# Patient Record
Sex: Male | Born: 1985 | Race: White | Hispanic: No | Marital: Single | Smoking: Never smoker
Health system: Southern US, Community
[De-identification: ages and names within clinical notes are randomized; demographics above are authoritative.]

## PROBLEM LIST (undated history)

## (undated) DIAGNOSIS — E782 Mixed hyperlipidemia: Secondary | ICD-10-CM

## (undated) DIAGNOSIS — J4530 Mild persistent asthma, uncomplicated: Secondary | ICD-10-CM

## (undated) DIAGNOSIS — F88 Other disorders of psychological development: Secondary | ICD-10-CM

## (undated) DIAGNOSIS — J302 Other seasonal allergic rhinitis: Secondary | ICD-10-CM

## (undated) DIAGNOSIS — F32A Depression, unspecified: Secondary | ICD-10-CM

## (undated) DIAGNOSIS — J45909 Unspecified asthma, uncomplicated: Secondary | ICD-10-CM

## (undated) DIAGNOSIS — G803 Athetoid cerebral palsy: Secondary | ICD-10-CM

## (undated) DIAGNOSIS — Z6838 Body mass index (BMI) 38.0-38.9, adult: Secondary | ICD-10-CM

## (undated) DIAGNOSIS — K2960 Other gastritis without bleeding: Secondary | ICD-10-CM

## (undated) DIAGNOSIS — R6250 Unspecified lack of expected normal physiological development in childhood: Secondary | ICD-10-CM

## (undated) DIAGNOSIS — I872 Venous insufficiency (chronic) (peripheral): Secondary | ICD-10-CM

## (undated) DIAGNOSIS — Q2730 Arteriovenous malformation, site unspecified: Secondary | ICD-10-CM

## (undated) DIAGNOSIS — K219 Gastro-esophageal reflux disease without esophagitis: Secondary | ICD-10-CM

## (undated) DIAGNOSIS — IMO0001 Reserved for inherently not codable concepts without codable children: Secondary | ICD-10-CM

## (undated) HISTORY — PX: STRABISMUS SURGERY: SHX218

## (undated) HISTORY — DX: Unspecified lack of expected normal physiological development in childhood: R62.50

## (undated) HISTORY — DX: Body mass index (BMI) 38.0-38.9, adult: Z68.38

## (undated) HISTORY — DX: Morbid (severe) obesity due to excess calories: E66.01

## (undated) HISTORY — DX: Other seasonal allergic rhinitis: J30.2

## (undated) HISTORY — DX: Venous insufficiency (chronic) (peripheral): I87.2

## (undated) HISTORY — PX: BRAIN SURGERY: SHX531

## (undated) HISTORY — DX: Athetoid cerebral palsy: G80.3

## (undated) HISTORY — DX: Unspecified asthma, uncomplicated: J45.909

## (undated) HISTORY — DX: Arteriovenous malformation, site unspecified: Q27.30

## (undated) HISTORY — DX: Other disorders of psychological development: F88

## (undated) HISTORY — PX: HERNIA REPAIR: SHX51

## (undated) HISTORY — PX: OTHER SURGICAL HISTORY: SHX169

## (undated) HISTORY — DX: Gastro-esophageal reflux disease without esophagitis: K21.9

## (undated) HISTORY — DX: Mixed hyperlipidemia: E78.2

## (undated) HISTORY — DX: Other gastritis without bleeding: K29.60

## (undated) HISTORY — PX: SKIN SURGERY: SHX2413

## (undated) HISTORY — DX: Mild persistent asthma, uncomplicated: J45.30

## (undated) HISTORY — DX: Reserved for inherently not codable concepts without codable children: IMO0001

## (undated) HISTORY — DX: Depression, unspecified: F32.A

---
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
## 2007-10-04 HISTORY — PX: HERNIA REPAIR: SHX51

## 2009-11-26 ENCOUNTER — Inpatient Hospital Stay (HOSPITAL_COMMUNITY): Admission: AD | Admit: 2009-11-26 | Discharge: 2009-11-30 | Payer: Self-pay | Admitting: Diagnostic Neuroimaging

## 2009-12-23 ENCOUNTER — Ambulatory Visit (HOSPITAL_COMMUNITY): Admission: RE | Admit: 2009-12-23 | Discharge: 2009-12-23 | Payer: Self-pay | Admitting: Neurosurgery

## 2010-01-07 ENCOUNTER — Ambulatory Visit: Payer: Self-pay | Admitting: Pulmonary Disease

## 2010-01-07 ENCOUNTER — Inpatient Hospital Stay (HOSPITAL_COMMUNITY): Admission: RE | Admit: 2010-01-07 | Discharge: 2010-01-11 | Payer: Self-pay | Admitting: Neurosurgery

## 2010-01-08 ENCOUNTER — Encounter (INDEPENDENT_AMBULATORY_CARE_PROVIDER_SITE_OTHER): Payer: Self-pay | Admitting: Neurosurgery

## 2010-02-01 ENCOUNTER — Ambulatory Visit (HOSPITAL_COMMUNITY): Admission: RE | Admit: 2010-02-01 | Discharge: 2010-02-01 | Payer: Self-pay | Admitting: Neurosurgery

## 2010-06-08 IMAGING — CT CT HEAD WO/W CM
2 of 5 series · 16 of 30 positions shown, 19 images · IV contrast (agent unspecified)
Comparison: Cerebral angiogram 11/27/2009.  CTA 11/27/2009.

CLINICAL DATA: 24-year-old male preoperative study for stereotactic
surgical planning.  Right MCA M3 segment AVM and intracranial
hemorrhage.

CT HEAD WITHOUT AND WITH CONTRAST
TECHNIQUE: Contiguous axial images were obtained from the base of
the skull through the vertex without and with intravenous contrast.
Contrast: 100 ml [id number].

[Series 4: stealth head w/o cm · axial · non-contrast · 0.59mm/px · z∈[-184,-49]mm · 9 of 136 slices shown, 12 images]
[im 14/136  brain]
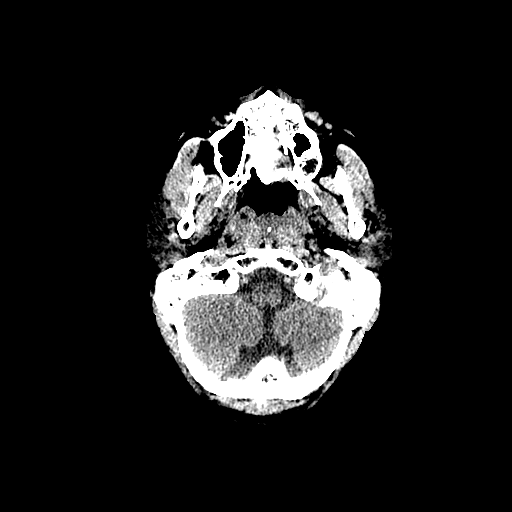
[im 14/136  bone]
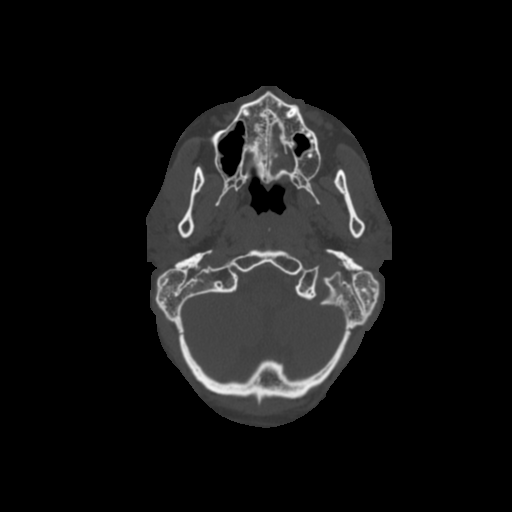
[im 28/136  brain]
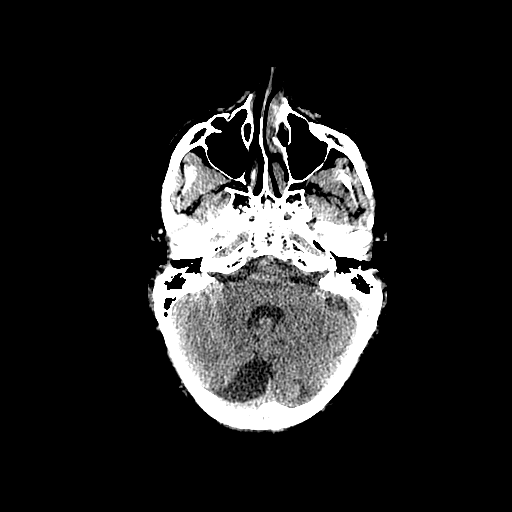
[im 41/136  brain]
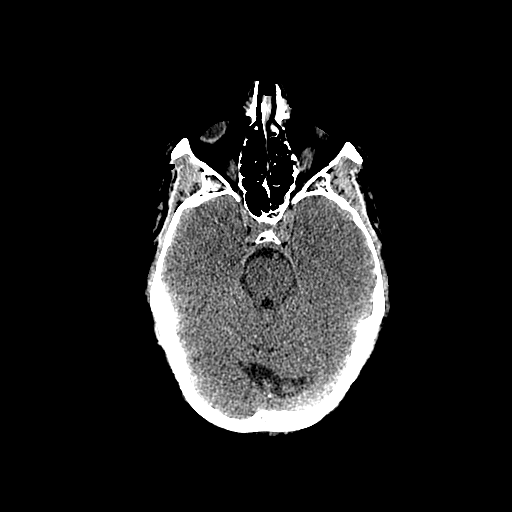
[im 55/136  brain]
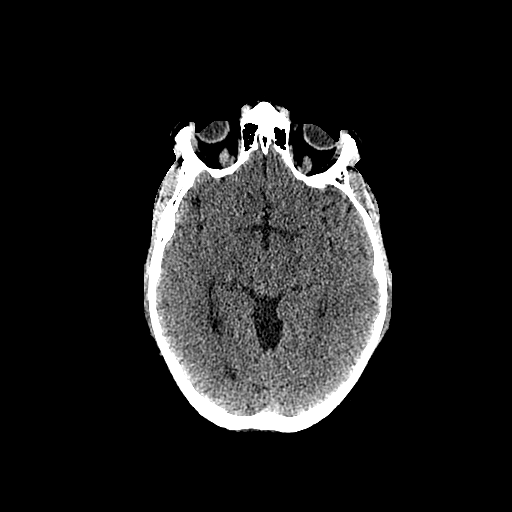
[im 68/136  brain]
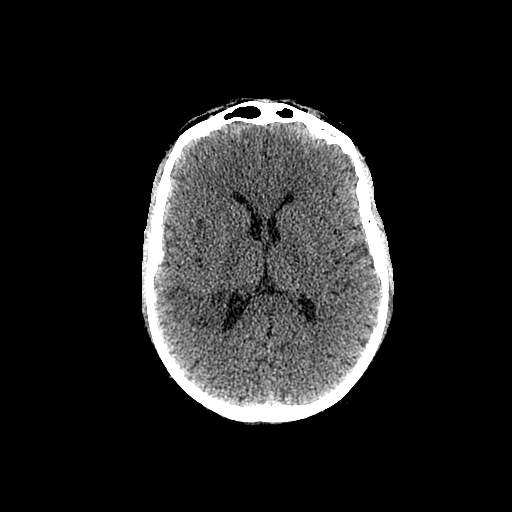
[im 68/136  bone]
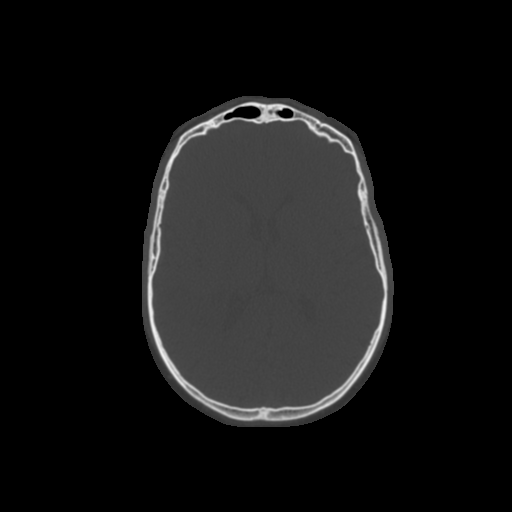
[im 82/136  brain]
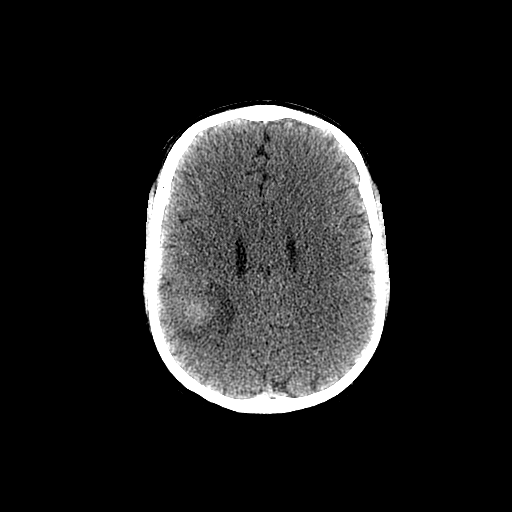
[im 95/136  brain]
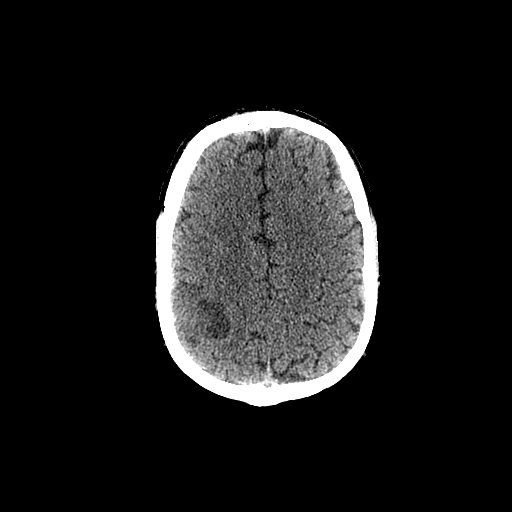
[im 109/136  brain]
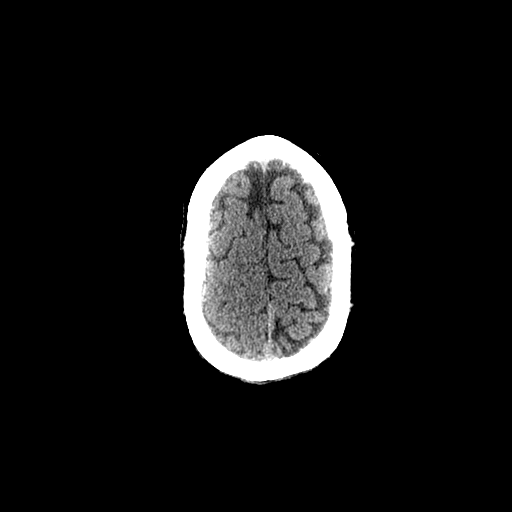
[im 122/136  brain]
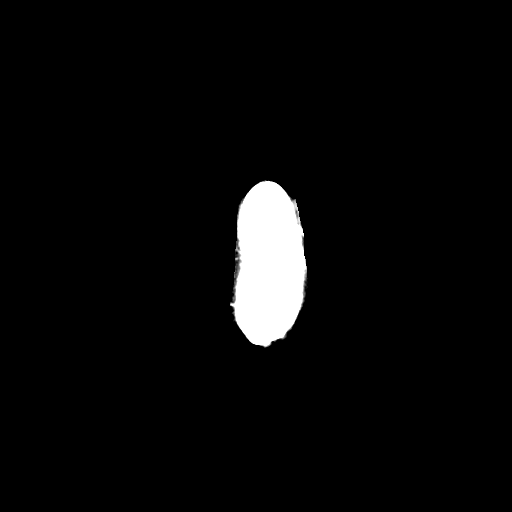
[im 122/136  bone]
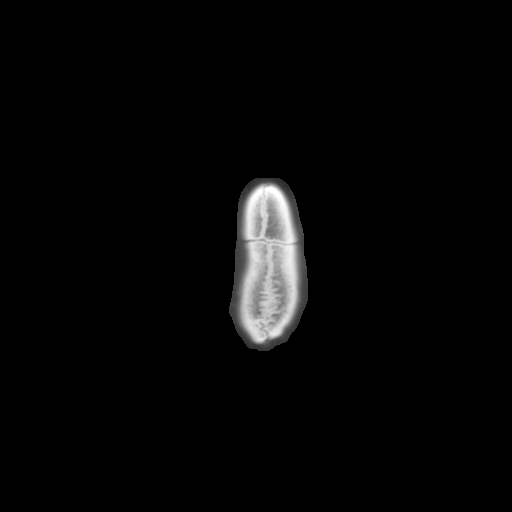

[Series 6: stealth (id) min delay · axial · delayed · 0.59mm/px · z∈[-184,-66]mm · 7 of 136 slices shown]
[im 14/136  brain]
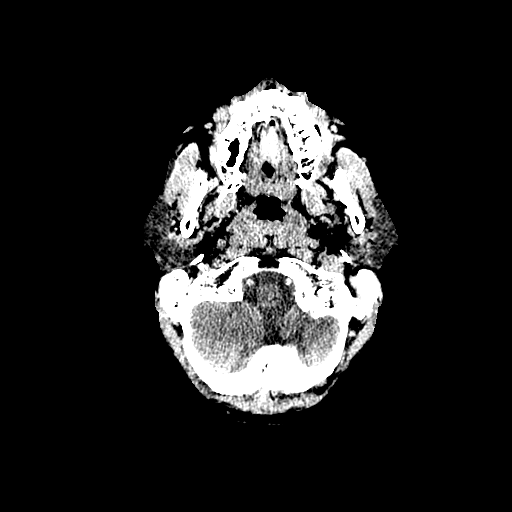
[im 28/136  brain]
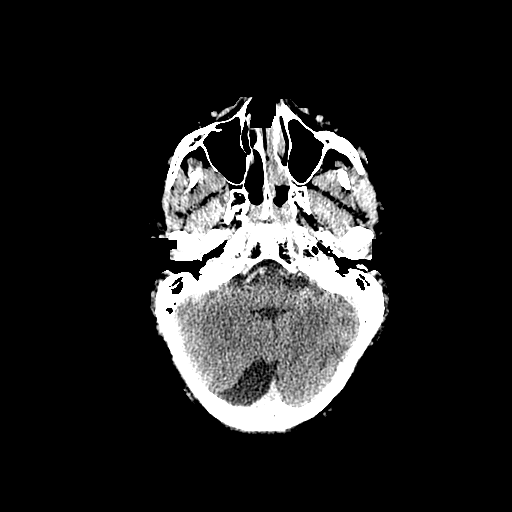
[im 41/136  brain]
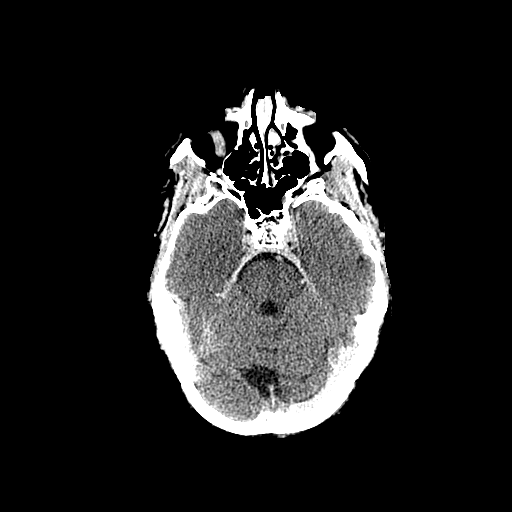
[im 55/136  brain]
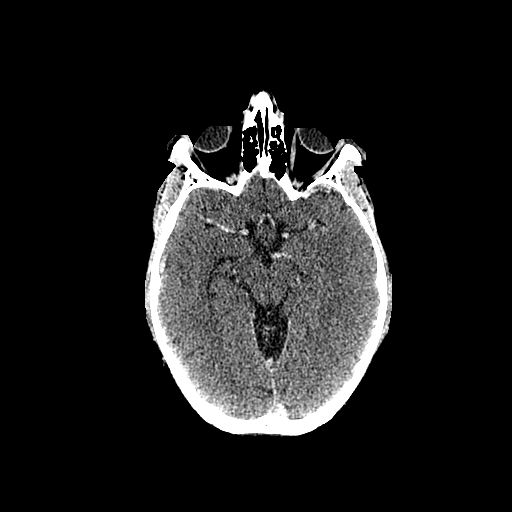
[im 82/136  brain]
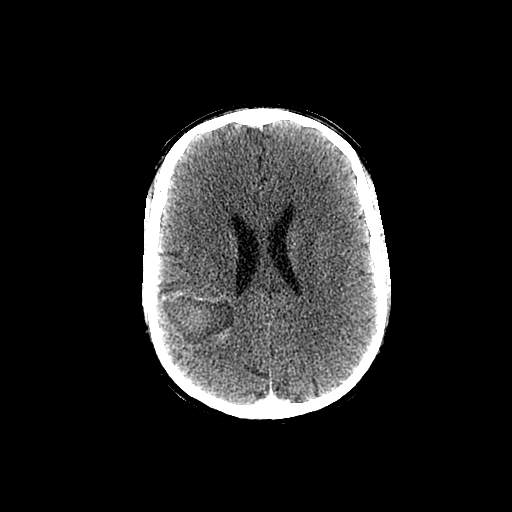
[im 95/136  brain]
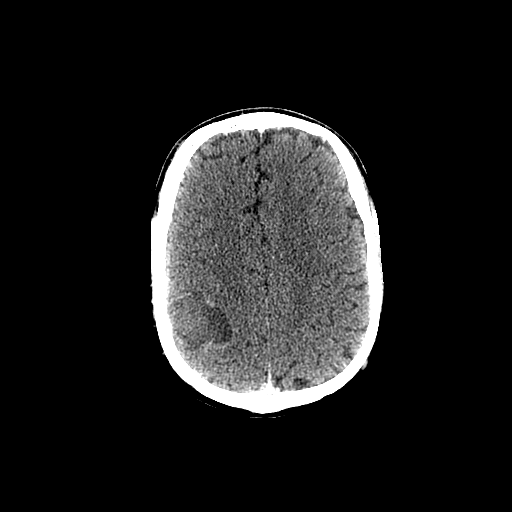
[im 109/136  brain]
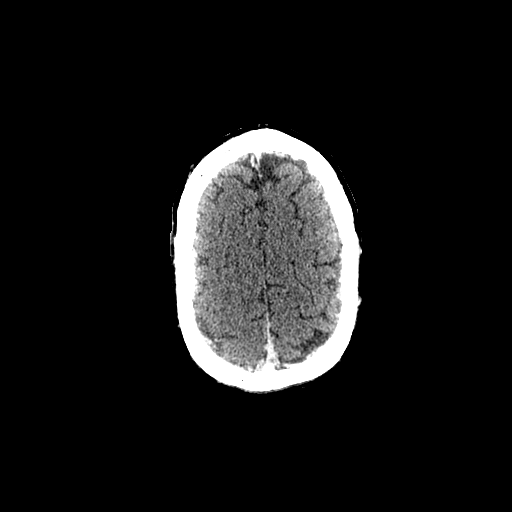

[16 of 30 positions shown; findings below may reference images not displayed]

FINDINGS: The patient's face is included from the level of the
maxillary alveolar process of the maxilla to the vertex. Interval
resolution of midline shift.  Interval expected evolution of the
right hemispheric parenchymal hemorrhage, with hyperdense component
now encompassing 27 x 24 mm (previously 47 x 34 mm).  No extra-
axial hemorrhage is identified. Ventricular size and configuration
are within normal limits.  Normal basilar cisterns.  Stable mega
cisterna magna. Stable visualized osseous structures.  Stable
orbits and scalp.

Postcontrast images reveal mild enhancement surrounding the
parenchymal hemorrhage.  The abnormal vascularity of the
arteriovenous malformation is most apparent on series 6 image 78
along the anterolateral aspect of the hematoma inferiorly.
Prominent draining cortical veins appear to communicate with the
right vein of Labbe as before which is asymmetrically enlarged on
image 56.  Other major intracranial vascular structures are
enhancing.
IMPRESSION: 1.  Study performed for stereotactic surgical planning.
2.  Postcontrast images reveal the right MCA arteriovenous
malformation on series 6 image 78.  Asymmetrically enlarged
superficial right cortical veins communicating with the vein of
Labbe.
3.  Interval resolved mass effect.  Interval expected evolution of
the right parenchymal hematoma with expected mild surrounding
enhancement.

## 2010-12-21 LAB — CBC
MCV: 88.3 fL (ref 78.0–100.0)
RBC: 4.3 MIL/uL (ref 4.22–5.81)
WBC: 7.1 10*3/uL (ref 4.0–10.5)

## 2010-12-21 LAB — BASIC METABOLIC PANEL
BUN: 14 mg/dL (ref 6–23)
CO2: 30 mEq/L (ref 19–32)
Calcium: 8.6 mg/dL (ref 8.4–10.5)
GFR calc Af Amer: >60 mL/min (ref >60)
Glucose, Bld: 86 mg/dL (ref 70–99)
Sodium: 141 mEq/L (ref 135–145)

## 2010-12-21 LAB — APTT: aPTT: 31 seconds (ref 24–37)

## 2010-12-21 LAB — PROTIME-INR: Prothrombin Time: 13.2 seconds (ref 11.6–15.2)

## 2010-12-22 LAB — BLOOD GAS, ARTERIAL
Acid-base deficit: 3.2 mmol/L — ABNORMAL HIGH (ref 0.0–2.0)
Acid-base deficit: 4.1 mmol/L — ABNORMAL HIGH (ref 0.0–2.0)
Bicarbonate: 21.7 mEq/L (ref 20.0–24.0)
FIO2: 0.3 %
FIO2: 0.3 %
MECHVT: 500 mL
O2 Saturation: 98.4 %
RATE: 15 resp/min
RATE: 15 resp/min
TCO2: 21.8 mmol/L (ref 0–100)
TCO2: 25.5 mmol/L (ref 0–100)
pCO2 arterial: 35.2 mmHg (ref 35.0–45.0)
pO2, Arterial: 116 mmHg — ABNORMAL HIGH (ref 80.0–100.0)

## 2010-12-22 LAB — TYPE AND SCREEN: Antibody Screen: NEGATIVE

## 2010-12-22 LAB — BASIC METABOLIC PANEL
BUN: 10 mg/dL (ref 6–23)
BUN: 15 mg/dL (ref 6–23)
CO2: 24 mEq/L (ref 19–32)
CO2: 26 mEq/L (ref 19–32)
Calcium: 7.6 mg/dL — ABNORMAL LOW (ref 8.4–10.5)
Calcium: 8.2 mg/dL — ABNORMAL LOW (ref 8.4–10.5)
Creatinine, Ser: 0.64 mg/dL (ref 0.4–1.5)
Creatinine, Ser: 0.72 mg/dL (ref 0.4–1.5)
GFR calc Af Amer: >60 mL/min (ref >60)
GFR calc non Af Amer: >60 mL/min (ref >60)
GFR calc non Af Amer: >60 mL/min (ref >60)
GFR calc non Af Amer: >60 mL/min (ref >60)
Glucose, Bld: 127 mg/dL — ABNORMAL HIGH (ref 70–99)
Glucose, Bld: 92 mg/dL (ref 70–99)
Sodium: 140 mEq/L (ref 135–145)
Sodium: 143 mEq/L (ref 135–145)

## 2010-12-22 LAB — POCT I-STAT 7, (LYTES, BLD GAS, ICA,H+H)
Acid-Base Excess: 1 mmol/L (ref 0.0–2.0)
Bicarbonate: 23.3 mEq/L (ref 20.0–24.0)
Calcium, Ion: 1.1 mmol/L — ABNORMAL LOW (ref 1.12–1.32)
Calcium, Ion: 1.13 mmol/L (ref 1.12–1.32)
HCT: 38 % — ABNORMAL LOW (ref 39.0–52.0)
HCT: 39 % (ref 39.0–52.0)
Hemoglobin: 11.6 g/dL — ABNORMAL LOW (ref 13.0–17.0)
O2 Saturation: 100 %
O2 Saturation: 100 %
Patient temperature: 37
Patient temperature: 37.2
Potassium: 3.5 mEq/L (ref 3.5–5.1)
Potassium: 4.1 mEq/L (ref 3.5–5.1)
pCO2 arterial: 36.3 mmHg (ref 35.0–45.0)
pCO2 arterial: 38.7 mmHg (ref 35.0–45.0)
pCO2 arterial: 39.9 mmHg (ref 35.0–45.0)
pH, Arterial: 7.376 (ref 7.350–7.450)
pH, Arterial: 7.426 (ref 7.350–7.450)
pO2, Arterial: 511 mmHg — ABNORMAL HIGH (ref 80.0–100.0)

## 2010-12-22 LAB — APTT: aPTT: 30 seconds (ref 24–37)

## 2010-12-22 LAB — DIFFERENTIAL
Basophils Absolute: 0 10*3/uL (ref 0.0–0.1)
Eosinophils Absolute: 0.4 10*3/uL (ref 0.0–0.7)
Eosinophils Relative: 5 % (ref 0–5)
Lymphocytes Relative: 20 % (ref 12–46)
Lymphs Abs: 1.6 10*3/uL (ref 0.7–4.0)
Neutro Abs: 5.3 10*3/uL (ref 1.7–7.7)
Neutrophils Relative %: 66 % (ref 43–77)

## 2010-12-22 LAB — PHOSPHORUS: Phosphorus: 2.2 mg/dL — ABNORMAL LOW (ref 2.3–4.6)

## 2010-12-22 LAB — ABO/RH: ABO/RH(D): O POS

## 2010-12-22 LAB — CBC
HCT: 30.8 % — ABNORMAL LOW (ref 39.0–52.0)
HCT: 44.2 % (ref 39.0–52.0)
Hemoglobin: 10.5 g/dL — ABNORMAL LOW (ref 13.0–17.0)
MCHC: 33.9 g/dL (ref 30.0–36.0)
Platelets: 151 10*3/uL (ref 150–400)
Platelets: 168 10*3/uL (ref 150–400)
RBC: 4.99 MIL/uL (ref 4.22–5.81)
WBC: 11.1 10*3/uL — ABNORMAL HIGH (ref 4.0–10.5)
WBC: 8.1 10*3/uL (ref 4.0–10.5)

## 2010-12-22 LAB — PROTIME-INR: INR: 1.01 (ref 0.00–1.49)

## 2010-12-22 LAB — MAGNESIUM: Magnesium: 2 mg/dL (ref 1.5–2.5)

## 2010-12-22 LAB — POCT I-STAT GLUCOSE: Operator id: 117072

## 2010-12-24 LAB — COMPREHENSIVE METABOLIC PANEL
ALT: 17 U/L (ref 0–53)
ALT: 19 U/L (ref 0–53)
AST: 16 U/L (ref 0–37)
BUN: 14 mg/dL (ref 6–23)
Calcium: 8.4 mg/dL (ref 8.4–10.5)
Calcium: 8.9 mg/dL (ref 8.4–10.5)
GFR calc Af Amer: >60 mL/min (ref >60)
Glucose, Bld: 115 mg/dL — ABNORMAL HIGH (ref 70–99)
Sodium: 136 mEq/L (ref 135–145)
Sodium: 138 mEq/L (ref 135–145)
Total Protein: 6.3 g/dL (ref 6.0–8.3)
Total Protein: 7.1 g/dL (ref 6.0–8.3)

## 2010-12-24 LAB — CBC
Hemoglobin: 13.9 g/dL (ref 13.0–17.0)
Hemoglobin: 14.3 g/dL (ref 13.0–17.0)
MCHC: 33.7 g/dL (ref 30.0–36.0)
MCHC: 34.1 g/dL (ref 30.0–36.0)
MCHC: 34.3 g/dL (ref 30.0–36.0)
MCV: 89.3 fL (ref 78.0–100.0)
Platelets: 261 10*3/uL (ref 150–400)
RDW: 13 % (ref 11.5–15.5)
RDW: 13.1 % (ref 11.5–15.5)
RDW: 13.2 % (ref 11.5–15.5)

## 2010-12-24 LAB — GLUCOSE, CAPILLARY
Glucose-Capillary: 113 mg/dL — ABNORMAL HIGH (ref 70–99)
Glucose-Capillary: 89 mg/dL (ref 70–99)
Glucose-Capillary: 92 mg/dL (ref 70–99)

## 2010-12-24 LAB — BASIC METABOLIC PANEL
CO2: 28 mEq/L (ref 19–32)
Calcium: 8.9 mg/dL (ref 8.4–10.5)
Creatinine, Ser: 0.76 mg/dL (ref 0.4–1.5)
Glucose, Bld: 94 mg/dL (ref 70–99)
Sodium: 137 mEq/L (ref 135–145)

## 2010-12-24 LAB — PROTIME-INR
INR: 1.01 (ref 0.00–1.49)
Prothrombin Time: 13.2 seconds (ref 11.6–15.2)

## 2012-03-06 DIAGNOSIS — J0100 Acute maxillary sinusitis, unspecified: Secondary | ICD-10-CM | POA: Diagnosis not present

## 2012-03-06 DIAGNOSIS — H66009 Acute suppurative otitis media without spontaneous rupture of ear drum, unspecified ear: Secondary | ICD-10-CM | POA: Diagnosis not present

## 2012-05-28 DIAGNOSIS — L819 Disorder of pigmentation, unspecified: Secondary | ICD-10-CM | POA: Diagnosis not present

## 2012-06-05 DIAGNOSIS — I770 Arteriovenous fistula, acquired: Secondary | ICD-10-CM | POA: Diagnosis not present

## 2012-08-03 DIAGNOSIS — Z23 Encounter for immunization: Secondary | ICD-10-CM | POA: Diagnosis not present

## 2013-03-19 DIAGNOSIS — J018 Other acute sinusitis: Secondary | ICD-10-CM | POA: Diagnosis not present

## 2013-03-19 DIAGNOSIS — T148 Other injury of unspecified body region: Secondary | ICD-10-CM | POA: Diagnosis not present

## 2013-04-18 DIAGNOSIS — R059 Cough, unspecified: Secondary | ICD-10-CM | POA: Diagnosis not present

## 2013-04-18 DIAGNOSIS — R05 Cough: Secondary | ICD-10-CM | POA: Diagnosis not present

## 2013-04-18 DIAGNOSIS — J309 Allergic rhinitis, unspecified: Secondary | ICD-10-CM | POA: Diagnosis not present

## 2013-04-18 DIAGNOSIS — J45909 Unspecified asthma, uncomplicated: Secondary | ICD-10-CM | POA: Diagnosis not present

## 2013-04-26 DIAGNOSIS — R002 Palpitations: Secondary | ICD-10-CM | POA: Diagnosis not present

## 2013-04-30 DIAGNOSIS — R002 Palpitations: Secondary | ICD-10-CM | POA: Diagnosis not present

## 2013-05-20 DIAGNOSIS — J45909 Unspecified asthma, uncomplicated: Secondary | ICD-10-CM | POA: Diagnosis not present

## 2013-05-20 DIAGNOSIS — J309 Allergic rhinitis, unspecified: Secondary | ICD-10-CM | POA: Diagnosis not present

## 2013-06-18 DIAGNOSIS — Z833 Family history of diabetes mellitus: Secondary | ICD-10-CM | POA: Diagnosis not present

## 2013-06-18 DIAGNOSIS — K219 Gastro-esophageal reflux disease without esophagitis: Secondary | ICD-10-CM | POA: Diagnosis not present

## 2013-06-18 DIAGNOSIS — Z8249 Family history of ischemic heart disease and other diseases of the circulatory system: Secondary | ICD-10-CM | POA: Diagnosis not present

## 2013-06-18 DIAGNOSIS — Z8679 Personal history of other diseases of the circulatory system: Secondary | ICD-10-CM | POA: Diagnosis not present

## 2013-06-18 DIAGNOSIS — Z789 Other specified health status: Secondary | ICD-10-CM | POA: Diagnosis not present

## 2013-06-18 DIAGNOSIS — R002 Palpitations: Secondary | ICD-10-CM | POA: Diagnosis not present

## 2013-07-04 DIAGNOSIS — R011 Cardiac murmur, unspecified: Secondary | ICD-10-CM | POA: Diagnosis not present

## 2013-07-11 DIAGNOSIS — R002 Palpitations: Secondary | ICD-10-CM | POA: Diagnosis not present

## 2013-07-16 DIAGNOSIS — Z23 Encounter for immunization: Secondary | ICD-10-CM | POA: Diagnosis not present

## 2013-07-16 DIAGNOSIS — R002 Palpitations: Secondary | ICD-10-CM | POA: Diagnosis not present

## 2013-07-22 DIAGNOSIS — J45909 Unspecified asthma, uncomplicated: Secondary | ICD-10-CM | POA: Diagnosis not present

## 2013-07-22 DIAGNOSIS — J309 Allergic rhinitis, unspecified: Secondary | ICD-10-CM | POA: Diagnosis not present

## 2013-08-12 ENCOUNTER — Ambulatory Visit (HOSPITAL_BASED_OUTPATIENT_CLINIC_OR_DEPARTMENT_OTHER): Payer: Medicare Other | Attending: Allergy and Immunology | Admitting: Radiology

## 2013-08-12 VITALS — Ht 68.0 in | Wt 213.0 lb

## 2013-08-12 DIAGNOSIS — G4730 Sleep apnea, unspecified: Secondary | ICD-10-CM

## 2013-08-12 DIAGNOSIS — I498 Other specified cardiac arrhythmias: Secondary | ICD-10-CM | POA: Insufficient documentation

## 2013-08-12 DIAGNOSIS — G4710 Hypersomnia, unspecified: Secondary | ICD-10-CM | POA: Insufficient documentation

## 2013-08-24 DIAGNOSIS — G4730 Sleep apnea, unspecified: Secondary | ICD-10-CM | POA: Diagnosis not present

## 2013-08-25 NOTE — Procedures (Signed)
NAMEIBRAHIM, MCPHEETERS                  ACCOUNT NO.:  [id number]  MEDICAL RECORD NO.:  [id number]          PATIENT TYPE:  OUT  LOCATION:  SLEEP CENTER                 FACILITY:  [facility]  PHYSICIAN:  Alzina Golda D. Maple Hudson, MD, FCCP, FACPDATE OF BIRTH:  07-07-1985  DATE OF STUDY:  08/12/2013                           NOCTURNAL POLYSOMNOGRAM  REFERRING PHYSICIAN:  Jessica Priest, M.D.  INDICATION FOR STUDY:  Hypersomnia with sleep apnea.  EPWORTH SLEEPINESS SCORE:  8/24.  BMI 32.4, weight 213 pounds, height 68 inches, neck 15.5 inches.  MEDICATIONS:  Home medications are charted for review.  SLEEP ARCHITECTURE:  Total sleep time 312 minutes with sleep efficiency 82%.  Stage I was 11.7%, stage II 64.4%, stage III 11.9%, REM 12% of total sleep time.  Sleep latency 4.5 minutes, REM latency 222.5 minutes. Awake after sleep onset 64 minutes.  Arousal index 17.5.  BEDTIME MEDICATION:  Omeprazole, loratadine, QVAR, Nasonex.  RESPIRATORY DATA:  Apnea-hypopnea index (AHI) 0.2 per hour.  A single event was recorded as a central apnea while nonsupine.  CPAP titration was not indicated.  OXYGEN DATA:  Mild-to-moderate snoring with oxygen desaturation to a nadir of 93% and mean oxygen saturation through the study of 97.6% on room air.  CARDIAC DATA:  Sinus rhythm with occasional PAC.  MOVEMENT-PARASOMNIA:  A few incidental limb jerks were noted with no effect on sleep.  Bathroom x2.  IMPRESSIONS-RECOMMENDATIONS: 1. Unremarkable sleep architecture for Sleep Center environment with     bedtime medications as listed above.  Percentage time spent in REM     was less than expected. 2. A single respiratory event with sleep disturbance was recorded,     within normal limits.  AHI 0.2 per hour.  Moderate snoring with     oxygen desaturation to a nadir of 93% and mean oxygen saturation     through the study of 97.6% on room air. 3. Cardiac rhythm was questioned in the ordering documents.  The     rhythm  was sinus bradycardia with mean     heart rate 52.1 per minute, maximum heart rate 115.4 per minute,     and minimum heart rate 35.1 per minute.  Rare premature atrial     contractions.     Alysiana Ethridge D. Maple Hudson, MD, Tonny Bollman, FACP Diplomate, American Board of Sleep Medicine    CDY/MEDQ  D:  08/24/2013 09:39:43  T:  08/25/2013 01:52:33  Job:  [id number]

## 2013-10-04 DIAGNOSIS — J209 Acute bronchitis, unspecified: Secondary | ICD-10-CM | POA: Diagnosis not present

## 2013-10-04 DIAGNOSIS — J018 Other acute sinusitis: Secondary | ICD-10-CM | POA: Diagnosis not present

## 2013-10-24 DIAGNOSIS — R350 Frequency of micturition: Secondary | ICD-10-CM | POA: Diagnosis not present

## 2013-10-24 DIAGNOSIS — R1013 Epigastric pain: Secondary | ICD-10-CM | POA: Diagnosis not present

## 2013-10-28 DIAGNOSIS — J45909 Unspecified asthma, uncomplicated: Secondary | ICD-10-CM | POA: Diagnosis not present

## 2013-10-28 DIAGNOSIS — J309 Allergic rhinitis, unspecified: Secondary | ICD-10-CM | POA: Diagnosis not present

## 2013-12-06 DIAGNOSIS — J209 Acute bronchitis, unspecified: Secondary | ICD-10-CM | POA: Diagnosis not present

## 2014-01-27 DIAGNOSIS — R197 Diarrhea, unspecified: Secondary | ICD-10-CM | POA: Diagnosis not present

## 2014-01-28 DIAGNOSIS — R197 Diarrhea, unspecified: Secondary | ICD-10-CM | POA: Diagnosis not present

## 2014-04-10 DIAGNOSIS — J309 Allergic rhinitis, unspecified: Secondary | ICD-10-CM | POA: Diagnosis not present

## 2014-04-10 DIAGNOSIS — J45909 Unspecified asthma, uncomplicated: Secondary | ICD-10-CM | POA: Diagnosis not present

## 2014-06-26 DIAGNOSIS — Z23 Encounter for immunization: Secondary | ICD-10-CM | POA: Diagnosis not present

## 2014-10-09 DIAGNOSIS — J309 Allergic rhinitis, unspecified: Secondary | ICD-10-CM | POA: Diagnosis not present

## 2014-10-09 DIAGNOSIS — J4540 Moderate persistent asthma, uncomplicated: Secondary | ICD-10-CM | POA: Diagnosis not present

## 2015-03-03 DIAGNOSIS — G809 Cerebral palsy, unspecified: Secondary | ICD-10-CM | POA: Diagnosis not present

## 2015-03-03 DIAGNOSIS — E669 Obesity, unspecified: Secondary | ICD-10-CM | POA: Diagnosis not present

## 2015-03-03 DIAGNOSIS — Z0000 Encounter for general adult medical examination without abnormal findings: Secondary | ICD-10-CM | POA: Diagnosis not present

## 2015-03-03 DIAGNOSIS — F819 Developmental disorder of scholastic skills, unspecified: Secondary | ICD-10-CM | POA: Diagnosis not present

## 2015-03-03 DIAGNOSIS — Z79899 Other long term (current) drug therapy: Secondary | ICD-10-CM | POA: Diagnosis not present

## 2015-03-03 DIAGNOSIS — F419 Anxiety disorder, unspecified: Secondary | ICD-10-CM | POA: Diagnosis not present

## 2015-04-09 DIAGNOSIS — J4540 Moderate persistent asthma, uncomplicated: Secondary | ICD-10-CM | POA: Diagnosis not present

## 2015-04-09 DIAGNOSIS — J309 Allergic rhinitis, unspecified: Secondary | ICD-10-CM | POA: Diagnosis not present

## 2015-06-07 DIAGNOSIS — L508 Other urticaria: Secondary | ICD-10-CM | POA: Diagnosis not present

## 2015-07-09 DIAGNOSIS — Z23 Encounter for immunization: Secondary | ICD-10-CM | POA: Diagnosis not present

## 2015-11-05 ENCOUNTER — Encounter: Payer: Self-pay | Admitting: Allergy and Immunology

## 2015-11-05 ENCOUNTER — Ambulatory Visit (INDEPENDENT_AMBULATORY_CARE_PROVIDER_SITE_OTHER): Payer: Medicare Other | Admitting: Allergy and Immunology

## 2015-11-05 VITALS — BP 130/80 | HR 60 | Resp 18 | Ht 68.0 in | Wt 216.1 lb

## 2015-11-05 DIAGNOSIS — J309 Allergic rhinitis, unspecified: Secondary | ICD-10-CM | POA: Diagnosis not present

## 2015-11-05 DIAGNOSIS — H1010 Acute atopic conjunctivitis, unspecified eye: Secondary | ICD-10-CM | POA: Diagnosis not present

## 2015-11-05 DIAGNOSIS — J4541 Moderate persistent asthma with (acute) exacerbation: Secondary | ICD-10-CM

## 2015-11-05 DIAGNOSIS — J387 Other diseases of larynx: Secondary | ICD-10-CM

## 2015-11-05 DIAGNOSIS — K219 Gastro-esophageal reflux disease without esophagitis: Secondary | ICD-10-CM

## 2015-11-05 MED ORDER — BUDESONIDE-FORMOTEROL FUMARATE 160-4.5 MCG/ACT IN AERO: 2.0000 | INHALATION_SPRAY | Freq: Two times a day (BID) | RESPIRATORY_TRACT | Status: DC

## 2015-11-05 MED ORDER — ALBUTEROL SULFATE HFA 108 (90 BASE) MCG/ACT IN AERS: 2.0000 | INHALATION_SPRAY | RESPIRATORY_TRACT | Status: DC | PRN

## 2015-11-05 MED ORDER — MOMETASONE FUROATE 50 MCG/ACT NA SUSP: 2.0000 | Freq: Every day | NASAL | Status: DC

## 2015-11-05 NOTE — Patient Instructions (Addendum)
  1. Evaluation with Dr. Lyndel Safe for reflux  2. Continue omeprazole 20 mg twice a day.  3. Consolidate all caffeine consumption  4. Change Qvar to Symbicort 160 - 2 inhalations twice a day  5. Add Qvar 80 2 inhalations twice a day to Symbicort if asthma flare  6. Use Nasonex 1-2 sprays each nostril 3-7 times per week  7. Use loratadine or ProAir HFA if needed  8. Return to clinic in 4 weeks.

## 2015-11-05 NOTE — Progress Notes (Signed)
Follow-up Note  Referring Provider: No ref. provider found Primary Provider: Myrlene Broker, MD Date of Office Visit: 11/05/2015  Subjective:   Antonio Jimenez is a 30 y.o. male who returns to the [facility] [place] on 11/05/2015 in re-evaluation of the following:  HPI Comments: Antonio Jimenez returns to this clinic in evaluation of his asthma and allergic rhinitis and LPR. I last saw him in this clinic in July 2016.  Antonio Jimenez is doing relatively well regarding his asthma. He is not required a systemic steroids since I last seen him in his clinic to treat an asthma exacerbation. He has not been having any problems requiring him to use a short acting bronchodilator. Apparently he can exercise without much problem. However, he still has a little bit of a lingering cough. His mom to taper down his omeprazole to once a day and his cough might of gotten a little bit worse and now he is back up to twice a day and he's a little bit better regarding his cough. In addition, he does have a problem with classic reflux and regurgitation. He still continues to drink some caffeine twice a day.  His nose is really been doing quite well and he has not had an much need to use Rhinocort at this point in time.   No current outpatient prescriptions on file prior to visit.   No current facility-administered medications on file prior to visit.    Past Medical History  Diagnosis Date  . Development delay   . Asthma   . Reflux     Past Surgical History  Procedure Laterality Date  . Strabismus surgery    . Hernia repair    . Brain surgery      ADM  . Rectal abcess surgery      No Known Allergies  Review of systems negative except as noted in HPI / PMHx or noted below:  Review of Systems  Constitutional: Negative.   HENT: Negative.   Eyes: Negative.   Respiratory: Negative.   Cardiovascular: Negative.   Gastrointestinal: Negative.   Genitourinary: Negative.   Musculoskeletal: Negative.     Skin: Negative.   Neurological: Negative.   Endo/Heme/Allergies: Negative.   Psychiatric/Behavioral: Negative.      Objective:   Filed Vitals:   11/05/15 1109  BP: 130/80  Pulse: 60  Resp: 18   Height: 5\' 8"  (172.7 cm)  Weight: 216 lb 0.8 oz (98 kg)   Physical Exam  Constitutional: He is well-developed, well-nourished, and in no distress.  HENT:  Head: Normocephalic.  Right Ear: External ear and ear canal normal. Tympanic membrane is scarred.  Left Ear: External ear and ear canal normal. Tympanic membrane is scarred.  Nose: Nose normal. No mucosal edema or rhinorrhea.  Mouth/Throat: Uvula is midline, oropharynx is clear and moist and mucous membranes are normal. No oropharyngeal exudate.  Eyes: Conjunctivae are normal.  Neck: Trachea normal. No tracheal tenderness present. No tracheal deviation present. No thyromegaly present.  Cardiovascular: Normal rate, regular rhythm, S1 normal, S2 normal and normal heart sounds.   No murmur heard. Pulmonary/Chest: Breath sounds normal. No stridor. No respiratory distress. He has no wheezes. He has no rales.  Musculoskeletal: He exhibits no edema.  Lymphadenopathy:       Head (right side): No tonsillar adenopathy present.       Head (left side): No tonsillar adenopathy present.    He has no cervical adenopathy.    He has no axillary adenopathy.  Neurological: He is alert. Gait normal.  Skin: No rash noted. He is not diaphoretic. No erythema. Nails show no clubbing.  Psychiatric: Mood and affect normal.    Diagnostics:    Spirometry was performed and demonstrated an FEV1 of 2.17 at 87 % of predicted.  The patient had an Asthma Control Test with the following results:  .    Assessment and Plan:   1. Asthma, not well controlled, moderate persistent, with acute exacerbation   2. Allergic rhinoconjunctivitis   3. LPRD (laryngopharyngeal reflux disease)     1. Evaluation with Dr. Lyndel Safe for reflux  2. Continue omeprazole 20 mg  twice a day.  3. Consolidate all caffeine consumption  4. Change Qvar to Symbicort 160 - 2 inhalations twice a day  5. Add Qvar 80 2 inhalations twice a day to Symbicort if asthma flare  6. Use Nasonex 1-2 sprays each nostril 3-7 times per week  7. Use loratadine or ProAir HFA if needed  8. Return to clinic in 4 weeks.  I have changed marks controller medication to Symbicort assuming that a component of his cough may still be tied up with some untreated asthma in the face of using his Qvar. As well, we will refer him on for an upper endoscopy given the fact that he still has reflux in the face of utilizing omeprazole twice a day.I will see him back in this clinic in 4 weeks to assess his response to Symbicort administration.  Allena Katz, MD [place]

## 2015-11-17 DIAGNOSIS — E669 Obesity, unspecified: Secondary | ICD-10-CM | POA: Diagnosis not present

## 2015-11-17 DIAGNOSIS — J4530 Mild persistent asthma, uncomplicated: Secondary | ICD-10-CM | POA: Diagnosis not present

## 2015-11-17 DIAGNOSIS — J302 Other seasonal allergic rhinitis: Secondary | ICD-10-CM | POA: Diagnosis not present

## 2015-11-17 DIAGNOSIS — K210 Gastro-esophageal reflux disease with esophagitis: Secondary | ICD-10-CM | POA: Diagnosis not present

## 2015-11-17 DIAGNOSIS — E782 Mixed hyperlipidemia: Secondary | ICD-10-CM | POA: Diagnosis not present

## 2015-12-10 ENCOUNTER — Ambulatory Visit: Admitting: Allergy and Immunology

## 2016-06-09 ENCOUNTER — Ambulatory Visit: Payer: Medicare Other | Admitting: Allergy and Immunology

## 2016-06-20 ENCOUNTER — Ambulatory Visit (INDEPENDENT_AMBULATORY_CARE_PROVIDER_SITE_OTHER): Payer: Medicare Other | Admitting: Allergy and Immunology

## 2016-06-20 ENCOUNTER — Encounter: Payer: Self-pay | Admitting: Allergy and Immunology

## 2016-06-20 VITALS — BP 110/80 | HR 74 | Resp 18

## 2016-06-20 DIAGNOSIS — K219 Gastro-esophageal reflux disease without esophagitis: Secondary | ICD-10-CM

## 2016-06-20 DIAGNOSIS — J4540 Moderate persistent asthma, uncomplicated: Secondary | ICD-10-CM | POA: Diagnosis not present

## 2016-06-20 DIAGNOSIS — J3089 Other allergic rhinitis: Secondary | ICD-10-CM | POA: Diagnosis not present

## 2016-06-20 DIAGNOSIS — J387 Other diseases of larynx: Secondary | ICD-10-CM

## 2016-06-20 NOTE — Patient Instructions (Signed)
  1. Obtain fall flu vaccine  2. Continue omeprazole 20 mg twice a day.  3. Consolidate all caffeine consumption  4. Change Symbicort to Qvar 80- 2 inhalations twice a day  5. Increase Qvar 80 to 3 inhalations 3 times a day if asthma flare  6. Use Nasonex 1-2 sprays each nostril 3-7 times per week  7. Use loratadine or ProAir HFA if needed  8. Return to clinic in 6 months.

## 2016-06-20 NOTE — Progress Notes (Signed)
Follow-up Note  Referring Provider: Myrlene Broker, MD Primary Provider: Myrlene Broker, MD Date of Office Visit: 06/20/2016  Subjective:   Antonio Jimenez (DOB: [birth year]) is a 30 y.o. male who returns to the [facility] and [place] on 06/20/2016 in re-evaluation of the following:  HPI: Ester returns to this clinic in reevaluation of his asthma and allergic rhinitis and LPR. I last saw him in this clinic in February 2017.  During the interval he has had excellent control of his asthma while consistently using his Symbicort. He does not have a need to use a short acting bronchodilator and he can consider exercise without any problem and has not required a systemic steroid to treat an exacerbation of his asthma nor activation of his action plan.  His nose has been doing quite well and has not required an antibiotic to treat an episode of sinusitis.  He has not been having any problems with his reflux. He still continues to drink about 2 caffeinated drinks per day.    Medication List      budesonide-formoterol 160-4.5 MCG/ACT inhaler Commonly known as:  SYMBICORT Inhale 2 puffs into the lungs 2 (two) times daily.   loratadine 10 MG tablet Commonly known as:  CLARITIN Take 10 mg by mouth daily.   mometasone 50 MCG/ACT nasal spray Commonly known as:  NASONEX Place 2 sprays into the nose daily. Two sprays each in each nostril   omeprazole 20 MG capsule Commonly known as:  PRILOSEC Take 20 mg by mouth 2 (two) times daily before a meal.   PROAIR HFA 108 (90 Base) MCG/ACT inhaler Generic drug:  albuterol Inhale 2 puffs into the lungs every 4 (four) hours as needed for wheezing or shortness of breath. Reported on 11/05/2015   QVAR 80 MCG/ACT inhaler Generic drug:  beclomethasone Inhale 2 puffs into the lungs 2 (two) times daily.       Past Medical History:  Diagnosis Date  . Asthma   . Development delay   . Reflux     Past Surgical History:  Procedure  Laterality Date  . BRAIN SURGERY     ADM  . HERNIA REPAIR    . RECTAL ABCESS SURGERY    . STRABISMUS SURGERY      No Known Allergies  Review of systems negative except as noted in HPI / PMHx or noted below:  Review of Systems  Constitutional: Negative.   HENT: Negative.   Eyes: Negative.   Respiratory: Negative.   Cardiovascular: Negative.   Gastrointestinal: Negative.   Genitourinary: Negative.   Musculoskeletal: Negative.   Skin: Negative.   Neurological: Negative.   Endo/Heme/Allergies: Negative.   Psychiatric/Behavioral: Negative.      Objective:   Vitals:   06/20/16 1625  BP: 110/80  Pulse: 74  Resp: 18          Physical Exam  Constitutional: He is well-developed, well-nourished, and in no distress.  HENT:  Head: Normocephalic.  Right Ear: External ear and ear canal normal. Tympanic membrane is scarred.  Left Ear: External ear and ear canal normal. Tympanic membrane is scarred.  Nose: Nose normal. No mucosal edema or rhinorrhea.  Mouth/Throat: Uvula is midline, oropharynx is clear and moist and mucous membranes are normal. No oropharyngeal exudate.  Eyes: Conjunctivae are normal.  Neck: Trachea normal. No tracheal tenderness present. No tracheal deviation present. No thyromegaly present.  Cardiovascular: Normal rate, regular rhythm, S1 normal, S2 normal and normal heart sounds.   No murmur  heard. Pulmonary/Chest: Breath sounds normal. No stridor. No respiratory distress. He has no wheezes. He has no rales.  Musculoskeletal: He exhibits no edema.  Lymphadenopathy:       Head (right side): No tonsillar adenopathy present.       Head (left side): No tonsillar adenopathy present.    He has no cervical adenopathy.  Neurological: He is alert. Gait normal.  Skin: No rash noted. He is not diaphoretic. No erythema. Nails show no clubbing.  Psychiatric: Mood and affect normal.    Diagnostics:    Spirometry was performed and demonstrated an FEV1 of 3.32 at 79  % of predicted.  The patient had an Asthma Control Test with the following results: ACT Total Score: 21.    Assessment and Plan:   1. Asthma, moderate persistent, well-controlled   2. Other allergic rhinitis   3. LPRD (laryngopharyngeal reflux disease)     1. Obtain fall flu vaccine  2. Continue omeprazole 20 mg twice a day.  3. Consolidate all caffeine consumption  4. Change Symbicort to Qvar 80- 2 inhalations twice a day  5. Increase Qvar 80 to 3 inhalations 3 times a day if asthma flare  6. Use Nasonex 1-2 sprays each nostril 3-7 times per week  7. Use loratadine or ProAir HFA if needed  8. Return to clinic in 6 months.  Wynston appears to be doing relatively well at this point in time and I'm going to see if we can change back to Qvar with the hope of receiving a good response regarding control of his asthma while use a monotherapy form of treatment rather than a dual agent inhaler. If he redevelops significant problems while we make this change that he'll need to go back on Symbicort. I'll see him back in this clinic in 6 months or earlier if there is a problem.  Allena Katz, MD [place]

## 2016-07-08 DIAGNOSIS — Z23 Encounter for immunization: Secondary | ICD-10-CM | POA: Diagnosis not present

## 2016-09-28 DIAGNOSIS — J329 Chronic sinusitis, unspecified: Secondary | ICD-10-CM | POA: Diagnosis not present

## 2016-09-28 DIAGNOSIS — J40 Bronchitis, not specified as acute or chronic: Secondary | ICD-10-CM | POA: Diagnosis not present

## 2016-12-22 ENCOUNTER — Ambulatory Visit (INDEPENDENT_AMBULATORY_CARE_PROVIDER_SITE_OTHER): Payer: Medicare Other | Admitting: Allergy and Immunology

## 2016-12-22 ENCOUNTER — Encounter: Payer: Self-pay | Admitting: Allergy and Immunology

## 2016-12-22 VITALS — BP 122/88 | HR 76 | Resp 20

## 2016-12-22 DIAGNOSIS — J4540 Moderate persistent asthma, uncomplicated: Secondary | ICD-10-CM | POA: Diagnosis not present

## 2016-12-22 DIAGNOSIS — J3089 Other allergic rhinitis: Secondary | ICD-10-CM | POA: Diagnosis not present

## 2016-12-22 DIAGNOSIS — K219 Gastro-esophageal reflux disease without esophagitis: Secondary | ICD-10-CM

## 2016-12-22 MED ORDER — FLUTICASONE PROPIONATE HFA 110 MCG/ACT IN AERO: INHALATION_SPRAY | RESPIRATORY_TRACT | 5 refills | Status: DC

## 2016-12-22 NOTE — Patient Instructions (Addendum)
  1. Continue omeprazole 20 mg 1-2 times per day a day depending on disease activity.  2. Consolidate all caffeine consumption  3. Change Qvar 80 or Flovent 110 - 2 inhalations twice a day  4. Increase Qvar 80 or Flovent 110 to 3 inhalations 3 times a day if asthma flare  5. Use Nasonex or Flonase 1-2 sprays each nostril 3-7 times per week  6. Use loratadine or ProAir HFA if needed  7. Return to clinic in 6 months.

## 2016-12-22 NOTE — Progress Notes (Signed)
Follow-up Note  Referring Provider: Myrlene Broker, MD Primary Provider: Myrlene Broker, MD Date of Office Visit: 12/22/2016  Subjective:   Antonio Jimenez (DOB: 11/07/1985) is a 31 y.o. male who returns to the [facility] and [place] on 12/22/2016 in re-evaluation of the following:  HPI: Natividad returns to this office in evaluation of his asthma and allergic rhinitis and LPR. His last visit was September 2017.  Overall he has had a relatively good interval and he has only had one episode of "bronchitis" which was apparently associated with a febrile illness that appear to infect the entire family for which he received prednisone and antibiotics at Christmas time. Otherwise he does well and does not use a short acting bronchodilator and can exert himself without much problem.  His nose has been doing very well. He has not required an antibiotic to treat an episode of sinusitis. He did have a recent episode of nasal congestion and stuffy nose and clear rhinorrhea without any fever or ugly nasal discharge or headaches and without any significant lower airway symptoms over the course of the past week but this is improving.  Reflux has been under excellent control. He is only using his proton pump inhibitor one time per day at this point. He still does drink caffeinated drinks throughout the day.  He did have his flu vaccine this year.  Allergies as of 12/22/2016   No Known Allergies     Medication List      citalopram 20 MG tablet Commonly known as:  CELEXA Take 20 mg by mouth daily.   fluticasone 110 MCG/ACT inhaler Commonly known as:  FLOVENT HFA Inhale two puffs twice a day to prevent cough or wheeze. Use three puffs three times daily during flare-up. Rinse, gargle, and spit.   loratadine 10 MG tablet Commonly known as:  CLARITIN Take 10 mg by mouth daily.   mometasone 50 MCG/ACT nasal spray Commonly known as:  NASONEX Place 2 sprays into the nose daily. Two sprays  each in each nostril   omeprazole 20 MG capsule Commonly known as:  PRILOSEC Take 20 mg by mouth 2 (two) times daily before a meal.   PROAIR HFA 108 (90 Base) MCG/ACT inhaler Generic drug:  albuterol Inhale 2 puffs into the lungs every 4 (four) hours as needed for wheezing or shortness of breath. Reported on 11/05/2015   QVAR 80 MCG/ACT inhaler Generic drug:  beclomethasone Inhale 2 puffs into the lungs 2 (two) times daily.       Past Medical History:  Diagnosis Date  . Asthma   . Development delay   . Reflux     Past Surgical History:  Procedure Laterality Date  . BRAIN SURGERY     ADM  . HERNIA REPAIR    . RECTAL ABCESS SURGERY    . STRABISMUS SURGERY      Review of systems negative except as noted in HPI / PMHx or noted below:  Review of Systems  Constitutional: Negative.   HENT: Negative.   Eyes: Negative.   Respiratory: Negative.   Cardiovascular: Negative.   Gastrointestinal: Negative.   Genitourinary: Negative.   Musculoskeletal: Negative.   Skin: Negative.   Neurological: Negative.   Endo/Heme/Allergies: Negative.   Psychiatric/Behavioral: Negative.      Objective:   Vitals:   12/22/16 1111  BP: 122/88  Pulse: 76  Resp: 20          Physical Exam  Constitutional: He is well-developed, well-nourished, and  in no distress.  HENT:  Head: Normocephalic.  Right Ear: Tympanic membrane, external ear and ear canal normal.  Left Ear: Tympanic membrane, external ear and ear canal normal.  Nose: Nose normal. No mucosal edema or rhinorrhea.  Mouth/Throat: Uvula is midline, oropharynx is clear and moist and mucous membranes are normal. No oropharyngeal exudate.  Eyes: Conjunctivae are normal.  Neck: Trachea normal. No tracheal tenderness present. No tracheal deviation present. No thyromegaly present.  Cardiovascular: Normal rate, regular rhythm, S1 normal, S2 normal and normal heart sounds.   No murmur heard. Pulmonary/Chest: Breath sounds normal. No  stridor. No respiratory distress. He has no wheezes. He has no rales.  Musculoskeletal: He exhibits no edema.  Lymphadenopathy:       Head (right side): No tonsillar adenopathy present.       Head (left side): No tonsillar adenopathy present.    He has no cervical adenopathy.  Neurological: He is alert. Gait normal.  Skin: No rash noted. He is not diaphoretic. No erythema. Nails show no clubbing.  Psychiatric: Mood and affect normal.    Diagnostics:    Spirometry was performed and demonstrated an FEV1 of 2.68 at 64 % of predicted.  Assessment and Plan:   1. Asthma, moderate persistent, well-controlled   2. Other allergic rhinitis   3. LPRD (laryngopharyngeal reflux disease)     1. Continue omeprazole 20 mg 1-2 times per day a day depending on disease activity.  2. Consolidate all caffeine consumption  3. Change Qvar 80 or Flovent 110 - 2 inhalations twice a day  4. Increase Qvar 80 or Flovent 110 to 3 inhalations 3 times a day if asthma flare  5. Use Nasonex or Flonase 1-2 sprays each nostril 3-7 times per week  6. Use loratadine or ProAir HFA if needed  7. Return to clinic in 6 months.  Jordell has really done very well and I see no need for changing his medical therapy at this point and he will continue to use an inhaled steroid and a nasal steroid at a dosage that is dependent on his disease activity and we will also continue to have him use therapy directed against reflux as noted above. I will see him back in this clinic in 6 months or earlier if there is a problem.  Allena Katz, MD Allergy / Immunology [place]

## 2017-05-04 DIAGNOSIS — K210 Gastro-esophageal reflux disease with esophagitis: Secondary | ICD-10-CM | POA: Diagnosis not present

## 2017-05-04 DIAGNOSIS — J302 Other seasonal allergic rhinitis: Secondary | ICD-10-CM | POA: Diagnosis not present

## 2017-05-04 DIAGNOSIS — E782 Mixed hyperlipidemia: Secondary | ICD-10-CM | POA: Diagnosis not present

## 2017-05-04 DIAGNOSIS — G809 Cerebral palsy, unspecified: Secondary | ICD-10-CM | POA: Diagnosis not present

## 2017-05-08 DIAGNOSIS — Z79899 Other long term (current) drug therapy: Secondary | ICD-10-CM | POA: Diagnosis not present

## 2017-05-08 DIAGNOSIS — E782 Mixed hyperlipidemia: Secondary | ICD-10-CM | POA: Diagnosis not present

## 2017-06-29 ENCOUNTER — Ambulatory Visit (INDEPENDENT_AMBULATORY_CARE_PROVIDER_SITE_OTHER): Payer: Medicare Other | Admitting: Allergy and Immunology

## 2017-06-29 ENCOUNTER — Encounter: Payer: Self-pay | Admitting: Allergy and Immunology

## 2017-06-29 VITALS — BP 110/76 | HR 80 | Resp 20

## 2017-06-29 DIAGNOSIS — J4540 Moderate persistent asthma, uncomplicated: Secondary | ICD-10-CM

## 2017-06-29 DIAGNOSIS — J3089 Other allergic rhinitis: Secondary | ICD-10-CM

## 2017-06-29 DIAGNOSIS — K219 Gastro-esophageal reflux disease without esophagitis: Secondary | ICD-10-CM | POA: Diagnosis not present

## 2017-06-29 MED ORDER — BUDESONIDE-FORMOTEROL FUMARATE 160-4.5 MCG/ACT IN AERO: INHALATION_SPRAY | RESPIRATORY_TRACT | 5 refills | Status: DC

## 2017-06-29 NOTE — Patient Instructions (Addendum)
  1. Continue omeprazole 20 mg 1-2 times per day a day depending on disease activity.  2. Consolidate all caffeine consumption  3. Symbicort 160 - 2 inhalations twice a day with spacer  4. "Action Plan" for asthma flare up:   A. Continue Symbicort  B. Add Flovent 110 - 3 inhalations 3 times a day  5. Can Use Flonase 1-2 sprays each nostril 3-7 times per week  6. Use loratadine or ProAir HFA if needed  7. Return to clinic in 6 months.  8. Obtain fall flu vaccine

## 2017-06-29 NOTE — Progress Notes (Signed)
Follow-up Note  Referring Provider: No ref. provider found Primary Provider: Street, Antonio Mt, MD Date of Office Visit: 06/29/2017  Subjective:   Antonio Jimenez (DOB: 10-Dec-1985) is a 31 y.o. male who returns to the [facility] [place] on 06/29/2017 in re-evaluation of the following:  HPI: Antonio Jimenez returns to this clinic in reevaluation of his asthma and allergic rhinitis and LPR. His last visit in this clinic was March 2018.  He contracted a head cold about 2 weeks ago with nasal congestion and sneezing and clear rhinorrhea and coughing. Although he has improved significantly he still has a little bit of a lingering cough since that event. He never had any fever or ugly nasal discharge or sputum production or chest pain.  Prior to visit and he has really done well during the interval and has not required any antibiotic for systemic steroid to treat an exacerbation. He does not use a short acting bronchodilator very often and has no problems exercising. Recently there is been an issue with using an inhaler. Apparently he does not like to use Flovent. We had changed him over to Flovent from his Qvar as his insurance would not cover Qvar. He pulled out an old Symbicort inhaler that he has had and he likes the result that he gets from this inhaler and he is tolerant of using this inhaler.  Reflux has been doing relatively well. He uses omeprazole for the most part one time per day. He still continues to drink caffeine extensively.  Allergies as of 06/29/2017   No Known Allergies     Medication List      citalopram 20 MG tablet Commonly known as:  CELEXA Take 20 mg by mouth daily.   fluticasone 110 MCG/ACT inhaler Commonly known as:  FLOVENT HFA Inhale two puffs twice a day to prevent cough or wheeze. Use three puffs three times daily during flare-up. Rinse, gargle, and spit.   loratadine 10 MG tablet Commonly known as:  CLARITIN Take 10 mg by mouth daily.   omeprazole  20 MG capsule Commonly known as:  PRILOSEC Take 20 mg by mouth 2 (two) times daily before a meal.   PROAIR HFA 108 (90 Base) MCG/ACT inhaler Generic drug:  albuterol Inhale 2 puffs into the lungs every 4 (four) hours as needed for wheezing or shortness of breath. Reported on 11/05/2015   SYMBICORT 160-4.5 MCG/ACT inhaler Generic drug:  budesonide-formoterol Inhale 2 puffs into the lungs 2 (two) times daily.       Past Medical History:  Diagnosis Date  . Asthma   . Development delay   . Reflux     Past Surgical History:  Procedure Laterality Date  . BRAIN SURGERY     ADM  . HERNIA REPAIR    . RECTAL ABCESS SURGERY    . STRABISMUS SURGERY      Review of systems negative except as noted in HPI / PMHx or noted below:  Review of Systems  Constitutional: Negative.   HENT: Negative.   Eyes: Negative.   Respiratory: Negative.   Cardiovascular: Negative.   Gastrointestinal: Negative.   Genitourinary: Negative.   Musculoskeletal: Negative.   Skin: Negative.   Neurological: Negative.   Endo/Heme/Allergies: Negative.   Psychiatric/Behavioral: Negative.      Objective:   Vitals:   06/29/17 1214  BP: 110/76  Pulse: 80  Resp: 20          Physical Exam  Constitutional: He is well-developed, well-nourished, and in  no distress.  HENT:  Head: Normocephalic.  Right Ear: External ear and ear canal normal. Tympanic membrane is scarred.  Left Ear: External ear and ear canal normal. Tympanic membrane is scarred.  Nose: Nose normal. No mucosal edema or rhinorrhea.  Mouth/Throat: Uvula is midline, oropharynx is clear and moist and mucous membranes are normal. No oropharyngeal exudate.  Eyes: Conjunctivae are normal.  Neck: Trachea normal. No tracheal tenderness present. No tracheal deviation present. No thyromegaly present.  Cardiovascular: Normal rate, regular rhythm, S1 normal, S2 normal and normal heart sounds.   No murmur heard. Pulmonary/Chest: Breath sounds normal.  No stridor. No respiratory distress. He has no wheezes. He has no rales.  Musculoskeletal: He exhibits no edema.  Lymphadenopathy:       Head (right side): No tonsillar adenopathy present.       Head (left side): No tonsillar adenopathy present.    He has no cervical adenopathy.  Neurological: He is alert. Gait normal.  Skin: No rash noted. He is not diaphoretic. No erythema. Nails show no clubbing.  Psychiatric: Mood and affect normal.    Diagnostics:    Spirometry was performed and demonstrated an FEV1 of 3.26 at 78 % of predicted.  The patient had an Asthma Control Test with the following results: ACT Total Score: 21.    Assessment and Plan:   1. Asthma, moderate persistent, well-controlled   2. Other allergic rhinitis   3. LPRD (laryngopharyngeal reflux disease)     1. Continue omeprazole 20 mg 1-2 times per day a day depending on disease activity.  2. Consolidate all caffeine consumption  3. Symbicort 160 - 2 inhalations twice a day with spacer  4. "Action Plan" for asthma flare up:   A. Continue Symbicort  B. Add Flovent 110 - 3 inhalations 3 times a day  5. Can Use Flonase 1-2 sprays each nostril 3-7 times per week  6. Use loratadine or ProAir HFA if needed  7. Return to clinic in 6 months.  8. Obtain fall flu vaccine  Overall Antonio Jimenez appears to be doing relatively well at this point. He contracted a head cold recently but he is resolving this issue and he does not appear to require either a systemic steroid or an antibiotic to clear up the lingering inflammation. We will keep him on Symbicort at this point in time and his action plan will now include the introduction of a inhaled steroid to his Symbicort should he develop a flare up in the future. He will continue on therapy for reflux. I will see him back in this clinic in 6 months or earlier if there is a problem.  Allena Katz, MD Allergy / Immunology [place]

## 2017-07-06 DIAGNOSIS — Z23 Encounter for immunization: Secondary | ICD-10-CM | POA: Diagnosis not present

## 2017-08-17 DIAGNOSIS — J329 Chronic sinusitis, unspecified: Secondary | ICD-10-CM | POA: Diagnosis not present

## 2017-08-17 DIAGNOSIS — J40 Bronchitis, not specified as acute or chronic: Secondary | ICD-10-CM | POA: Diagnosis not present

## 2017-08-17 DIAGNOSIS — Z6833 Body mass index (BMI) 33.0-33.9, adult: Secondary | ICD-10-CM | POA: Diagnosis not present

## 2017-11-14 DIAGNOSIS — Z6833 Body mass index (BMI) 33.0-33.9, adult: Secondary | ICD-10-CM | POA: Diagnosis not present

## 2017-11-14 DIAGNOSIS — J209 Acute bronchitis, unspecified: Secondary | ICD-10-CM | POA: Diagnosis not present

## 2017-12-20 DIAGNOSIS — L270 Generalized skin eruption due to drugs and medicaments taken internally: Secondary | ICD-10-CM | POA: Diagnosis not present

## 2017-12-20 DIAGNOSIS — L299 Pruritus, unspecified: Secondary | ICD-10-CM | POA: Diagnosis not present

## 2017-12-28 ENCOUNTER — Ambulatory Visit: Admitting: Allergy and Immunology

## 2018-01-16 DIAGNOSIS — F819 Developmental disorder of scholastic skills, unspecified: Secondary | ICD-10-CM | POA: Diagnosis not present

## 2018-01-16 DIAGNOSIS — R739 Hyperglycemia, unspecified: Secondary | ICD-10-CM | POA: Diagnosis not present

## 2018-01-16 DIAGNOSIS — Z79899 Other long term (current) drug therapy: Secondary | ICD-10-CM | POA: Diagnosis not present

## 2018-01-16 DIAGNOSIS — G809 Cerebral palsy, unspecified: Secondary | ICD-10-CM | POA: Diagnosis not present

## 2018-01-16 DIAGNOSIS — E782 Mixed hyperlipidemia: Secondary | ICD-10-CM | POA: Diagnosis not present

## 2018-01-16 DIAGNOSIS — K210 Gastro-esophageal reflux disease with esophagitis: Secondary | ICD-10-CM | POA: Diagnosis not present

## 2018-01-17 ENCOUNTER — Other Ambulatory Visit: Payer: Self-pay | Admitting: Allergy and Immunology

## 2018-06-06 DIAGNOSIS — H269 Unspecified cataract: Secondary | ICD-10-CM | POA: Diagnosis not present

## 2018-06-06 DIAGNOSIS — H52222 Regular astigmatism, left eye: Secondary | ICD-10-CM | POA: Diagnosis not present

## 2018-06-06 DIAGNOSIS — H53022 Refractive amblyopia, left eye: Secondary | ICD-10-CM | POA: Diagnosis not present

## 2018-07-16 DIAGNOSIS — Z23 Encounter for immunization: Secondary | ICD-10-CM | POA: Diagnosis not present

## 2018-10-11 ENCOUNTER — Ambulatory Visit (INDEPENDENT_AMBULATORY_CARE_PROVIDER_SITE_OTHER): Payer: Medicare Other | Admitting: Allergy and Immunology

## 2018-10-11 ENCOUNTER — Encounter: Payer: Self-pay | Admitting: Allergy and Immunology

## 2018-10-11 VITALS — BP 102/78 | HR 72 | Resp 18 | Ht 68.3 in | Wt 233.2 lb

## 2018-10-11 DIAGNOSIS — J3089 Other allergic rhinitis: Secondary | ICD-10-CM

## 2018-10-11 DIAGNOSIS — J4540 Moderate persistent asthma, uncomplicated: Secondary | ICD-10-CM

## 2018-10-11 DIAGNOSIS — K219 Gastro-esophageal reflux disease without esophagitis: Secondary | ICD-10-CM

## 2018-10-11 NOTE — Progress Notes (Signed)
Follow-up Note  Referring Provider: Street, Sharon Jimenez, * Primary Provider: Street, Sharon Mt, MD Date of Office Visit: 10/11/2018  Subjective:   Antonio Jimenez (DOB: [birth year]) is a 33 y.o. male who returns to the [facility] [place] on 10/11/2018 in re-evaluation of the following:  HPI: Antonio Jimenez presents to this clinic in evaluation of asthma and allergic rhinitis and LPR.  His last visit to this clinic was 29 June 2017.  He has really done very well during the interval without the need for any systemic steroid or antibiotic to treat a respiratory tract issue and rare use of a short acting bronchodilator.  He is now using Flovent mostly 1 time per day.  His nose has really been doing quite well. He does not use any nasal steroid at this point.  His reflux is under excellent control while using omeprazole mostly 1 time per day.  He did obtain the flu vaccine this fall.  Allergies as of 10/11/2018   No Known Allergies     Medication List      budesonide-formoterol 160-4.5 MCG/ACT inhaler Commonly known as:  SYMBICORT Inhale two puffs using spacer twice daily to prevent cough or wheeze.  Rinse, gargle, and spit after use.   citalopram 20 MG tablet Commonly known as:  CELEXA Take 20 mg by mouth daily.   fluticasone 110 MCG/ACT inhaler Commonly known as:  FLOVENT HFA INHALE 2 PUFFS TWICE A DAY TO PREVENT COUGH OR WHEEZE. USE 3 PUFFS 3 TIMES A DAY DURING FLARE UP.   loratadine 10 MG tablet Commonly known as:  CLARITIN Take 10 mg by mouth daily.   omeprazole 20 MG capsule Commonly known as:  PRILOSEC Take 20 mg by mouth 2 (two) times daily before a meal.   PROAIR HFA 108 (90 Base) MCG/ACT inhaler Generic drug:  albuterol Inhale 2 puffs into the lungs every 4 (four) hours as needed for wheezing or shortness of breath. Reported on 11/05/2015       Past Medical History:  Diagnosis Date  . Asthma   . Development delay   . Reflux     Past Surgical  History:  Procedure Laterality Date  . BRAIN SURGERY     ADM  . HERNIA REPAIR    . RECTAL ABCESS SURGERY    . STRABISMUS SURGERY      Review of systems negative except as noted in HPI / PMHx or noted below:  Review of Systems  Constitutional: Negative.   HENT: Negative.   Eyes: Negative.   Respiratory: Negative.   Cardiovascular: Negative.   Gastrointestinal: Negative.   Genitourinary: Negative.   Musculoskeletal: Negative.   Skin: Negative.   Neurological: Negative.   Endo/Heme/Allergies: Negative.   Psychiatric/Behavioral: Negative.      Objective:   Vitals:   10/11/18 1551  BP: 102/78  Pulse: 72  Resp: 18   Height: 5' 8.3" (173.5 cm)  Weight: 233 lb 3.2 oz (105.8 kg)   Physical Exam Constitutional:      Appearance: He is not diaphoretic.  HENT:     Head: Normocephalic.     Right Ear: Tympanic membrane, ear canal and external ear normal.     Left Ear: Tympanic membrane, ear canal and external ear normal.     Nose: Nose normal. No mucosal edema or rhinorrhea.     Mouth/Throat:     Pharynx: Uvula midline. No oropharyngeal exudate.  Eyes:     Conjunctiva/sclera: Conjunctivae normal.  Neck:  Thyroid: No thyromegaly.     Trachea: Trachea normal. No tracheal tenderness or tracheal deviation.  Cardiovascular:     Rate and Rhythm: Normal rate and regular rhythm.     Heart sounds: Normal heart sounds, S1 normal and S2 normal. No murmur.  Pulmonary:     Effort: No respiratory distress.     Breath sounds: Normal breath sounds. No stridor. No wheezing or rales.  Lymphadenopathy:     Head:     Right side of head: No tonsillar adenopathy.     Left side of head: No tonsillar adenopathy.     Cervical: No cervical adenopathy.  Skin:    Findings: No erythema or rash.     Nails: There is no clubbing.   Neurological:     Mental Status: He is alert.     Diagnostics:    Spirometry was performed and demonstrated an FEV1 of 2.52 at 61 % of  predicted.  Assessment and Plan:   1. Asthma, moderate persistent, well-controlled   2. Other allergic rhinitis   3. LPRD (laryngopharyngeal reflux disease)     1. Continue omeprazole 20 mg 1-2 times per day a day depending on disease activity.  2.  Flovent 110 - 2 inhalations 1-2 times a day depending on disease activity  3. "Action Plan" for asthma flare up:   A. Add Flovent 110 - 3 inhalations 3 times a day  4. Can Use Flonase 1-2 sprays each nostril 3-7 times per week if needed  5. Use loratadine or ProAir HFA if needed  6. Return to clinic in 12 months.  Antonio Jimenez appears to be doing very well on his current medical therapy which includes therapy directed against both inflammation of his airway and reflux.  He will continue on this plan and I will see him back in this clinic in 1 year or earlier if there is a problem.  Antonio Katz, MD Allergy / Immunology [place]

## 2018-10-11 NOTE — Patient Instructions (Addendum)
  1. Continue omeprazole 20 mg 1-2 times per day a day depending on disease activity.  2.  Flovent 110 - 2 inhalations 1-2 times a day depending on disease activity  3. "Action Plan" for asthma flare up:   A. Add Flovent 110 - 3 inhalations 3 times a day  4. Can Use Flonase 1-2 sprays each nostril 3-7 times per week if needed  5. Use loratadine or ProAir HFA if needed  6. Return to clinic in 12 months.

## 2018-10-15 ENCOUNTER — Encounter: Payer: Self-pay | Admitting: Allergy and Immunology

## 2018-10-26 DIAGNOSIS — R05 Cough: Secondary | ICD-10-CM | POA: Diagnosis not present

## 2018-10-27 DIAGNOSIS — J189 Pneumonia, unspecified organism: Secondary | ICD-10-CM | POA: Diagnosis not present

## 2018-10-27 DIAGNOSIS — R509 Fever, unspecified: Secondary | ICD-10-CM | POA: Diagnosis not present

## 2018-12-03 ENCOUNTER — Other Ambulatory Visit: Payer: Self-pay

## 2018-12-03 MED ORDER — FLUTICASONE PROPIONATE HFA 110 MCG/ACT IN AERO: INHALATION_SPRAY | RESPIRATORY_TRACT | 3 refills | Status: DC

## 2018-12-28 DIAGNOSIS — Z79899 Other long term (current) drug therapy: Secondary | ICD-10-CM | POA: Diagnosis not present

## 2018-12-28 DIAGNOSIS — R739 Hyperglycemia, unspecified: Secondary | ICD-10-CM | POA: Diagnosis not present

## 2018-12-28 DIAGNOSIS — J302 Other seasonal allergic rhinitis: Secondary | ICD-10-CM | POA: Diagnosis not present

## 2018-12-28 DIAGNOSIS — E782 Mixed hyperlipidemia: Secondary | ICD-10-CM | POA: Diagnosis not present

## 2018-12-28 DIAGNOSIS — K210 Gastro-esophageal reflux disease with esophagitis: Secondary | ICD-10-CM | POA: Diagnosis not present

## 2018-12-28 DIAGNOSIS — J4530 Mild persistent asthma, uncomplicated: Secondary | ICD-10-CM | POA: Diagnosis not present

## 2019-05-27 DIAGNOSIS — K2970 Gastritis, unspecified, without bleeding: Secondary | ICD-10-CM | POA: Diagnosis not present

## 2019-06-03 DIAGNOSIS — K2970 Gastritis, unspecified, without bleeding: Secondary | ICD-10-CM | POA: Diagnosis not present

## 2019-06-17 DIAGNOSIS — R1310 Dysphagia, unspecified: Secondary | ICD-10-CM | POA: Diagnosis not present

## 2019-06-17 DIAGNOSIS — K219 Gastro-esophageal reflux disease without esophagitis: Secondary | ICD-10-CM | POA: Diagnosis not present

## 2019-07-12 DIAGNOSIS — K2950 Unspecified chronic gastritis without bleeding: Secondary | ICD-10-CM | POA: Diagnosis not present

## 2019-07-12 DIAGNOSIS — K2100 Gastro-esophageal reflux disease with esophagitis, without bleeding: Secondary | ICD-10-CM | POA: Diagnosis not present

## 2019-07-12 DIAGNOSIS — R05 Cough: Secondary | ICD-10-CM | POA: Diagnosis not present

## 2019-07-12 DIAGNOSIS — K317 Polyp of stomach and duodenum: Secondary | ICD-10-CM | POA: Diagnosis not present

## 2019-07-12 DIAGNOSIS — K222 Esophageal obstruction: Secondary | ICD-10-CM | POA: Diagnosis not present

## 2019-07-12 DIAGNOSIS — K219 Gastro-esophageal reflux disease without esophagitis: Secondary | ICD-10-CM | POA: Diagnosis not present

## 2019-07-12 DIAGNOSIS — K2101 Gastro-esophageal reflux disease with esophagitis, with bleeding: Secondary | ICD-10-CM | POA: Diagnosis not present

## 2019-07-12 DIAGNOSIS — K2210 Ulcer of esophagus without bleeding: Secondary | ICD-10-CM | POA: Diagnosis not present

## 2019-07-12 DIAGNOSIS — Z79899 Other long term (current) drug therapy: Secondary | ICD-10-CM | POA: Diagnosis not present

## 2019-07-12 DIAGNOSIS — J45909 Unspecified asthma, uncomplicated: Secondary | ICD-10-CM | POA: Diagnosis not present

## 2019-07-12 DIAGNOSIS — R1310 Dysphagia, unspecified: Secondary | ICD-10-CM | POA: Diagnosis not present

## 2019-07-15 DIAGNOSIS — Z23 Encounter for immunization: Secondary | ICD-10-CM | POA: Diagnosis not present

## 2019-08-15 DIAGNOSIS — K2101 Gastro-esophageal reflux disease with esophagitis, with bleeding: Secondary | ICD-10-CM | POA: Diagnosis not present

## 2019-09-17 ENCOUNTER — Other Ambulatory Visit: Payer: Self-pay | Admitting: Allergy and Immunology

## 2019-10-13 ENCOUNTER — Other Ambulatory Visit: Payer: Self-pay | Admitting: Allergy and Immunology

## 2019-12-09 DIAGNOSIS — Z23 Encounter for immunization: Secondary | ICD-10-CM | POA: Diagnosis not present

## 2019-12-31 DIAGNOSIS — E782 Mixed hyperlipidemia: Secondary | ICD-10-CM | POA: Diagnosis not present

## 2019-12-31 DIAGNOSIS — Z79899 Other long term (current) drug therapy: Secondary | ICD-10-CM | POA: Diagnosis not present

## 2019-12-31 DIAGNOSIS — K2100 Gastro-esophageal reflux disease with esophagitis, without bleeding: Secondary | ICD-10-CM | POA: Diagnosis not present

## 2019-12-31 DIAGNOSIS — J4530 Mild persistent asthma, uncomplicated: Secondary | ICD-10-CM | POA: Diagnosis not present

## 2019-12-31 DIAGNOSIS — G809 Cerebral palsy, unspecified: Secondary | ICD-10-CM | POA: Diagnosis not present

## 2019-12-31 DIAGNOSIS — R739 Hyperglycemia, unspecified: Secondary | ICD-10-CM | POA: Diagnosis not present

## 2020-01-13 DIAGNOSIS — Z23 Encounter for immunization: Secondary | ICD-10-CM | POA: Diagnosis not present

## 2020-05-07 DIAGNOSIS — J40 Bronchitis, not specified as acute or chronic: Secondary | ICD-10-CM | POA: Diagnosis not present

## 2020-05-07 DIAGNOSIS — J302 Other seasonal allergic rhinitis: Secondary | ICD-10-CM | POA: Diagnosis not present

## 2020-05-07 DIAGNOSIS — J329 Chronic sinusitis, unspecified: Secondary | ICD-10-CM | POA: Diagnosis not present

## 2020-05-07 DIAGNOSIS — J4530 Mild persistent asthma, uncomplicated: Secondary | ICD-10-CM | POA: Diagnosis not present

## 2020-06-27 DIAGNOSIS — Z23 Encounter for immunization: Secondary | ICD-10-CM | POA: Diagnosis not present

## 2020-09-02 DIAGNOSIS — Z23 Encounter for immunization: Secondary | ICD-10-CM | POA: Diagnosis not present

## 2020-09-23 DIAGNOSIS — R7302 Impaired glucose tolerance (oral): Secondary | ICD-10-CM | POA: Diagnosis not present

## 2020-09-23 DIAGNOSIS — E782 Mixed hyperlipidemia: Secondary | ICD-10-CM | POA: Diagnosis not present

## 2020-09-23 DIAGNOSIS — G809 Cerebral palsy, unspecified: Secondary | ICD-10-CM | POA: Diagnosis not present

## 2020-09-23 DIAGNOSIS — Z79899 Other long term (current) drug therapy: Secondary | ICD-10-CM | POA: Diagnosis not present

## 2020-09-23 DIAGNOSIS — F819 Developmental disorder of scholastic skills, unspecified: Secondary | ICD-10-CM | POA: Diagnosis not present

## 2020-09-23 DIAGNOSIS — Z0000 Encounter for general adult medical examination without abnormal findings: Secondary | ICD-10-CM | POA: Diagnosis not present

## 2021-01-02 DIAGNOSIS — L2089 Other atopic dermatitis: Secondary | ICD-10-CM | POA: Diagnosis not present

## 2021-01-14 DIAGNOSIS — B358 Other dermatophytoses: Secondary | ICD-10-CM | POA: Diagnosis not present

## 2021-01-14 DIAGNOSIS — B354 Tinea corporis: Secondary | ICD-10-CM | POA: Diagnosis not present

## 2021-01-14 DIAGNOSIS — L2089 Other atopic dermatitis: Secondary | ICD-10-CM | POA: Diagnosis not present

## 2021-01-14 DIAGNOSIS — L03119 Cellulitis of unspecified part of limb: Secondary | ICD-10-CM | POA: Diagnosis not present

## 2021-02-10 DIAGNOSIS — R7302 Impaired glucose tolerance (oral): Secondary | ICD-10-CM | POA: Diagnosis not present

## 2021-02-10 DIAGNOSIS — M79662 Pain in left lower leg: Secondary | ICD-10-CM | POA: Diagnosis not present

## 2021-02-10 DIAGNOSIS — Z79899 Other long term (current) drug therapy: Secondary | ICD-10-CM | POA: Diagnosis not present

## 2021-02-10 DIAGNOSIS — G803 Athetoid cerebral palsy: Secondary | ICD-10-CM | POA: Diagnosis not present

## 2021-02-10 DIAGNOSIS — M7989 Other specified soft tissue disorders: Secondary | ICD-10-CM | POA: Diagnosis not present

## 2021-02-10 DIAGNOSIS — I872 Venous insufficiency (chronic) (peripheral): Secondary | ICD-10-CM | POA: Diagnosis not present

## 2021-02-10 DIAGNOSIS — E782 Mixed hyperlipidemia: Secondary | ICD-10-CM | POA: Diagnosis not present

## 2021-02-19 DIAGNOSIS — M79661 Pain in right lower leg: Secondary | ICD-10-CM | POA: Diagnosis not present

## 2021-02-19 DIAGNOSIS — M7989 Other specified soft tissue disorders: Secondary | ICD-10-CM | POA: Diagnosis not present

## 2021-02-19 DIAGNOSIS — M79662 Pain in left lower leg: Secondary | ICD-10-CM | POA: Diagnosis not present

## 2021-03-08 DIAGNOSIS — Z20828 Contact with and (suspected) exposure to other viral communicable diseases: Secondary | ICD-10-CM | POA: Diagnosis not present

## 2021-03-08 DIAGNOSIS — J329 Chronic sinusitis, unspecified: Secondary | ICD-10-CM | POA: Diagnosis not present

## 2021-03-08 DIAGNOSIS — J40 Bronchitis, not specified as acute or chronic: Secondary | ICD-10-CM | POA: Diagnosis not present

## 2021-03-08 DIAGNOSIS — J3489 Other specified disorders of nose and nasal sinuses: Secondary | ICD-10-CM | POA: Diagnosis not present

## 2021-03-23 DIAGNOSIS — L918 Other hypertrophic disorders of the skin: Secondary | ICD-10-CM | POA: Diagnosis not present

## 2021-03-23 DIAGNOSIS — L578 Other skin changes due to chronic exposure to nonionizing radiation: Secondary | ICD-10-CM | POA: Diagnosis not present

## 2021-03-23 DIAGNOSIS — C44319 Basal cell carcinoma of skin of other parts of face: Secondary | ICD-10-CM | POA: Diagnosis not present

## 2021-03-23 DIAGNOSIS — I8310 Varicose veins of unspecified lower extremity with inflammation: Secondary | ICD-10-CM | POA: Diagnosis not present

## 2021-04-13 DIAGNOSIS — C44319 Basal cell carcinoma of skin of other parts of face: Secondary | ICD-10-CM | POA: Diagnosis not present

## 2021-07-07 DIAGNOSIS — Z23 Encounter for immunization: Secondary | ICD-10-CM | POA: Diagnosis not present

## 2021-07-16 DIAGNOSIS — Z23 Encounter for immunization: Secondary | ICD-10-CM | POA: Diagnosis not present

## 2021-07-19 DIAGNOSIS — L700 Acne vulgaris: Secondary | ICD-10-CM | POA: Diagnosis not present

## 2021-07-19 DIAGNOSIS — C44319 Basal cell carcinoma of skin of other parts of face: Secondary | ICD-10-CM | POA: Diagnosis not present

## 2021-10-05 DIAGNOSIS — Z79899 Other long term (current) drug therapy: Secondary | ICD-10-CM | POA: Diagnosis not present

## 2021-10-05 DIAGNOSIS — R7301 Impaired fasting glucose: Secondary | ICD-10-CM | POA: Diagnosis not present

## 2021-10-05 DIAGNOSIS — K2960 Other gastritis without bleeding: Secondary | ICD-10-CM | POA: Diagnosis not present

## 2021-10-05 DIAGNOSIS — F88 Other disorders of psychological development: Secondary | ICD-10-CM | POA: Diagnosis not present

## 2021-10-05 DIAGNOSIS — Z0000 Encounter for general adult medical examination without abnormal findings: Secondary | ICD-10-CM | POA: Diagnosis not present

## 2021-10-05 DIAGNOSIS — G803 Athetoid cerebral palsy: Secondary | ICD-10-CM | POA: Diagnosis not present

## 2021-10-05 DIAGNOSIS — E782 Mixed hyperlipidemia: Secondary | ICD-10-CM | POA: Diagnosis not present

## 2022-06-20 DIAGNOSIS — I8310 Varicose veins of unspecified lower extremity with inflammation: Secondary | ICD-10-CM | POA: Diagnosis not present

## 2022-06-20 DIAGNOSIS — L821 Other seborrheic keratosis: Secondary | ICD-10-CM | POA: Diagnosis not present

## 2022-06-20 DIAGNOSIS — L219 Seborrheic dermatitis, unspecified: Secondary | ICD-10-CM | POA: Diagnosis not present

## 2022-07-11 DIAGNOSIS — Z6838 Body mass index (BMI) 38.0-38.9, adult: Secondary | ICD-10-CM | POA: Diagnosis not present

## 2022-07-11 DIAGNOSIS — Z23 Encounter for immunization: Secondary | ICD-10-CM | POA: Diagnosis not present

## 2022-07-11 DIAGNOSIS — F88 Other disorders of psychological development: Secondary | ICD-10-CM | POA: Diagnosis not present

## 2022-07-11 DIAGNOSIS — I872 Venous insufficiency (chronic) (peripheral): Secondary | ICD-10-CM | POA: Diagnosis not present

## 2022-08-15 DIAGNOSIS — H53022 Refractive amblyopia, left eye: Secondary | ICD-10-CM | POA: Diagnosis not present

## 2022-08-23 ENCOUNTER — Other Ambulatory Visit: Payer: Self-pay

## 2022-08-23 DIAGNOSIS — J4530 Mild persistent asthma, uncomplicated: Secondary | ICD-10-CM | POA: Insufficient documentation

## 2022-08-23 DIAGNOSIS — F88 Other disorders of psychological development: Secondary | ICD-10-CM | POA: Insufficient documentation

## 2022-08-23 DIAGNOSIS — J45909 Unspecified asthma, uncomplicated: Secondary | ICD-10-CM | POA: Insufficient documentation

## 2022-08-23 DIAGNOSIS — J302 Other seasonal allergic rhinitis: Secondary | ICD-10-CM | POA: Insufficient documentation

## 2022-08-23 DIAGNOSIS — I872 Venous insufficiency (chronic) (peripheral): Secondary | ICD-10-CM | POA: Insufficient documentation

## 2022-08-23 DIAGNOSIS — E782 Mixed hyperlipidemia: Secondary | ICD-10-CM | POA: Insufficient documentation

## 2022-08-23 DIAGNOSIS — Z6838 Body mass index (BMI) 38.0-38.9, adult: Secondary | ICD-10-CM | POA: Insufficient documentation

## 2022-08-23 DIAGNOSIS — E785 Hyperlipidemia, unspecified: Secondary | ICD-10-CM | POA: Insufficient documentation

## 2022-08-23 DIAGNOSIS — R6250 Unspecified lack of expected normal physiological development in childhood: Secondary | ICD-10-CM | POA: Insufficient documentation

## 2022-08-23 DIAGNOSIS — K2960 Other gastritis without bleeding: Secondary | ICD-10-CM | POA: Insufficient documentation

## 2022-08-23 DIAGNOSIS — G803 Athetoid cerebral palsy: Secondary | ICD-10-CM | POA: Insufficient documentation

## 2022-09-01 ENCOUNTER — Encounter: Payer: Self-pay | Admitting: Allergy and Immunology

## 2022-09-01 ENCOUNTER — Ambulatory Visit (INDEPENDENT_AMBULATORY_CARE_PROVIDER_SITE_OTHER): Payer: Medicare Other | Admitting: Allergy and Immunology

## 2022-09-01 VITALS — BP 112/82 | HR 80 | Resp 16 | Ht 68.5 in | Wt 257.0 lb

## 2022-09-01 DIAGNOSIS — J4530 Mild persistent asthma, uncomplicated: Secondary | ICD-10-CM | POA: Diagnosis not present

## 2022-09-01 DIAGNOSIS — J3089 Other allergic rhinitis: Secondary | ICD-10-CM

## 2022-09-01 DIAGNOSIS — K219 Gastro-esophageal reflux disease without esophagitis: Secondary | ICD-10-CM | POA: Diagnosis not present

## 2022-09-01 MED ORDER — ALBUTEROL SULFATE HFA 108 (90 BASE) MCG/ACT IN AERS: INHALATION_SPRAY | RESPIRATORY_TRACT | 1 refills | Status: AC

## 2022-09-01 NOTE — Progress Notes (Signed)
[place] [facility] [place] [facility] [place] - [place] - [place]   Dear Dr. Venetia Maxon,  Thank you for referring Antonio Jimenez to the [facility] on 09/01/2022.   Below is a summation of this patient's evaluation and recommendations.  Thank you for your referral. I will keep you informed about this patient's response to treatment.   If you have any questions please do not hesitate to contact me.   Sincerely,  Jiles Prows, MD Allergy / Immunology [place]   ______________________________________________________________________    NEW PATIENT NOTE  Referring Provider: Street, Sharon Mt, * Primary Provider: Street, Sharon Mt, MD Date of office visit: 09/01/2022    Subjective:   Chief Complaint:  Antonio Jimenez (DOB: 1986/08/12) is a 36 y.o. male who presents to the clinic on 09/01/2022 with a chief complaint of Asthma .     HPI: Antonio Jimenez presents to this clinic in evaluation of asthma, allergic rhinitis, reflux.  I had seen him in this clinic over 3 years ago.  He has done very well with his asthma and it does not sound as though he has required a systemic steroid to treat an exacerbation and he rarely uses the short acting bronchodilator while he continues to use Flovent mostly 1 time per day.  He has done very well with his nose while using loratadine.  He does not use any nasal steroid.  He has received this years flu vaccine.  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[place])    AVM (arteriovenous malformation)    BMI 38.0-38.9,adult    Depression    Development delay    Global developmental delay    Mild persistent asthma without complication    Mixed dyslipidemia    Morbid obesity ([place])    Reflux gastritis    Seasonal allergies    Venous stasis dermatitis of both lower extremities     Past Surgical History:  Procedure Laterality Date    BRAIN SURGERY     AVM   HERNIA REPAIR  2009   [place]   RECTAL ABCESS SURGERY  2004   SKIN SURGERY     STRABISMUS SURGERY      Allergies as of 09/01/2022   No Known Allergies      Medication List    citalopram 20 MG tablet Commonly known as: CELEXA Take 20 mg by mouth daily.   Flovent HFA 110 MCG/ACT inhaler Generic drug: fluticasone INHALE 2 PUFFS TWICE A DAY TO PREVENT COUGH OR WHEEZE. USE 3 PUFFS 3 TIMES A DAY DURING FLARE UP.   loratadine 10 MG tablet Commonly known as: CLARITIN Take 10 mg by mouth daily.   omeprazole 20 MG capsule Commonly known as: PRILOSEC Take 20 mg by mouth 2 (two) times daily before a meal.    Review of systems negative except as noted in HPI / PMHx or noted below:  Review of Systems  Constitutional: Negative.   HENT: Negative.    Eyes: Negative.   Respiratory: Negative.    Cardiovascular: Negative.   Gastrointestinal: Negative.   Genitourinary: Negative.   Musculoskeletal: Negative.   Skin: Negative.   Neurological: Negative.   Endo/Heme/Allergies: Negative.   Psychiatric/Behavioral: Negative.      Family History  Problem Relation Age of Onset   Asthma Mother    Allergic rhinitis Mother    Hypertension Mother  Lung cancer Father    Diabetes Father    Atrial fibrillation Father    AVM Father    Asthma Sister    Allergic rhinitis Sister    Stroke Paternal Grandmother     Social History   Socioeconomic History   Marital status: Single    Spouse name: Not on file   Number of children: Not on file   Years of education: Not on file   Highest education level: Not on file  Occupational History   Not on file  Tobacco Use   Smoking status: Never   Smokeless tobacco: Never  Substance and Sexual Activity   Alcohol use: Not Currently   Drug use: Never   Sexual activity: Not on file  Other Topics Concern   Not on file  Social History Narrative   Not on file   Environmental and Social  history  Lives in a house with a dry environment, cats located inside the household, carpet in the bedroom, no plastic on the bed, no plastic on the pillow, no smoking ongoing with inside the household.  Objective:   Vitals:   09/01/22 1458  BP: 112/82  Pulse: 80  Resp: 16  SpO2: 94%   Height: 5' 8.5" (174 cm) Weight: 257 lb (116.6 kg)  Physical Exam Constitutional:      Appearance: He is not diaphoretic.  HENT:     Head: Normocephalic.     Right Ear: Ear canal and external ear normal. Tympanic membrane is scarred.     Left Ear: Ear canal and external ear normal. Tympanic membrane is scarred.     Nose: Nose normal. No mucosal edema or rhinorrhea.     Mouth/Throat:     Pharynx: Uvula midline. No oropharyngeal exudate.  Eyes:     Conjunctiva/sclera: Conjunctivae normal.  Neck:     Thyroid: No thyromegaly.     Trachea: Trachea normal. No tracheal tenderness or tracheal deviation.  Cardiovascular:     Rate and Rhythm: Normal rate and regular rhythm.     Heart sounds: Normal heart sounds, S1 normal and S2 normal. No murmur heard. Pulmonary:     Effort: No respiratory distress.     Breath sounds: Normal breath sounds. No stridor. No wheezing or rales.  Lymphadenopathy:     Head:     Right side of head: No tonsillar adenopathy.     Left side of head: No tonsillar adenopathy.     Cervical: No cervical adenopathy.  Skin:    Findings: No erythema or rash.     Nails: There is no clubbing.  Neurological:     Mental Status: He is alert.     Diagnostics: Allergy skin tests were not performed.   Spirometry was performed and demonstrated an FEV1 of 2.43 @ 60 % of predicted. FEV1/FVC = 0.80   Assessment and Plan:    1. Asthma, well controlled, mild persistent   2. Perennial allergic rhinitis   3. Gastroesophageal reflux disease, unspecified whether esophagitis present    1.  Continue Flovent 110 HFA - 2 inhalations 1-2 times per day depending on disease activity.  2.   Continue omeprazole 20 mg 1-2 times per day depending on disease activity  3.  If needed:   A.  Loratadine 10 mg - 1 tablet 1 time per day  B.  Albuterol HFA-2 inhalations every 4-6 hours  4.  Return to clinic in 1 year or earlier if problem  Antonio Jimenez appears to be doing quite well on  his current plan and he and his mom have a good understanding of how to alter his medications depending on disease activity.  He will continue on anti-inflammatory agents for his airway and a proton pump inhibitor for his reflux and we will see him back in this clinic in 1 year or earlier if there is a problem.  Jiles Prows, MD Allergy / Immunology [facility]

## 2022-09-01 NOTE — Patient Instructions (Addendum)
  1.  Continue Flovent 110 HFA - 2 inhalations 1-2 times per day depending on disease activity.  2.  Continue omeprazole 20 mg 1-2 times per day depending on disease activity  3.  If needed:   A.  Loratadine 10 mg - 1 tablet 1 time per day  B.  Albuterol HFA-2 inhalations every 4-6 hours  4.  Return to clinic in 1 year or earlier if problem

## 2022-09-05 ENCOUNTER — Encounter: Payer: Self-pay | Admitting: Allergy and Immunology

## 2022-09-15 ENCOUNTER — Encounter: Payer: Self-pay | Admitting: Cardiology

## 2022-09-15 ENCOUNTER — Ambulatory Visit: Payer: Medicare Other | Attending: Cardiology

## 2022-09-15 ENCOUNTER — Ambulatory Visit: Payer: Medicare Other | Attending: Cardiology | Admitting: Cardiology

## 2022-09-15 VITALS — BP 116/80 | HR 96 | Ht 68.0 in | Wt 260.2 lb

## 2022-09-15 DIAGNOSIS — R002 Palpitations: Secondary | ICD-10-CM

## 2022-09-15 DIAGNOSIS — I872 Venous insufficiency (chronic) (peripheral): Secondary | ICD-10-CM | POA: Diagnosis not present

## 2022-09-15 DIAGNOSIS — R011 Cardiac murmur, unspecified: Secondary | ICD-10-CM | POA: Diagnosis not present

## 2022-09-15 DIAGNOSIS — E782 Mixed hyperlipidemia: Secondary | ICD-10-CM | POA: Diagnosis not present

## 2022-09-15 DIAGNOSIS — M79605 Pain in left leg: Secondary | ICD-10-CM | POA: Diagnosis not present

## 2022-09-15 DIAGNOSIS — M79604 Pain in right leg: Secondary | ICD-10-CM

## 2022-09-15 HISTORY — DX: Palpitations: R00.2

## 2022-09-15 HISTORY — DX: Cardiac murmur, unspecified: R01.1

## 2022-09-15 NOTE — Patient Instructions (Signed)
Medication Instructions:  Your physician recommends that you continue on your current medications as directed. Please refer to the Current Medication list given to you today.  *If you need a refill on your cardiac medications before your next appointment, please call your pharmacy*   Lab Work: None ordered If you have labs (blood work) drawn today and your tests are completely normal, you will receive your results only by: Antonio Jimenez (if you have MyChart) OR A paper copy in the mail If you have any lab test that is abnormal or we need to change your treatment, we will call you to review the results.   Testing/Procedures: Your physician has requested that you have an echocardiogram. Echocardiography is a painless test that uses sound waves to create images of your heart. It provides your doctor with information about the size and shape of your heart and how well your heart's chambers and valves are working. This procedure takes approximately one hour. There are no restrictions for this procedure.  Your physician has requested that you have an ankle brachial index (ABI). During this test an ultrasound and blood pressure cuff are used to evaluate the arteries that supply the arms and legs with blood. Allow thirty minutes for this exam. There are no restrictions or special instructions.   WHY IS MY DOCTOR PRESCRIBING ZIO? The Zio system is proven and trusted by physicians to detect and diagnose irregular heart rhythms -- and has been prescribed to hundreds of thousands of patients.  The FDA has cleared the Zio system to monitor for many different kinds of irregular heart rhythms. In a study, physicians were able to reach a diagnosis 90% of the time with the Zio system1.  You can wear the Zio monitor -- a small, discreet, comfortable patch -- during your normal day-to-day activity, including while you sleep, shower, and exercise, while it records every single heartbeat for  analysis.  1Barrett, P., et al. Comparison of 24 Hour Holter Monitoring Versus 14 Day Novel Adhesive Patch Electrocardiographic Monitoring. Stockett, 2014.  ZIO VS. HOLTER MONITORING The Zio monitor can be comfortably worn for up to 14 days. Holter monitors can be worn for 24 to 48 hours, limiting the time to record any irregular heart rhythms you may have. Zio is able to capture data for the 51% of patients who have their first symptom-triggered arrhythmia after 48 hours.1  LIVE WITHOUT RESTRICTIONS The Zio ambulatory cardiac monitor is a small, unobtrusive, and water-resistant patch--you might even forget you're wearing it. The Zio monitor records and stores every beat of your heart, whether you're sleeping, working out, or showering. Wear the monitor for 14 days, remove 09/29/22.     Follow-Up: At [facility], you and your health needs are our priority.  As part of our continuing mission to provide you with exceptional heart care, we have created designated Provider Care Teams.  These Care Teams include your primary Cardiologist (physician) and Advanced Practice Providers (APPs -  Physician Assistants and Nurse Practitioners) who all work together to provide you with the care you need, when you need it.  We recommend signing up for the patient portal called "MyChart".  Sign up information is provided on this After Visit Summary.  MyChart is used to connect with patients for Virtual Visits (Telemedicine).  Patients are able to view lab/test results, encounter notes, upcoming appointments, etc.  Non-urgent messages can be sent to your provider as well.   To learn more about what you can do  with MyChart, go to [email or web address].    Your next appointment:   12 month(s)  The format for your next appointment:   In Person  Provider:   [name], MD   Other Instructions Echocardiogram An echocardiogram is a test that uses sound waves (ultrasound) to  produce images of the heart. Images from an echocardiogram can provide important information about: Heart size and shape. The size and thickness and movement of your heart's walls. Heart muscle function and strength. Heart valve function or if you have stenosis. Stenosis is when the heart valves are too narrow. If blood is flowing backward through the heart valves (regurgitation). A tumor or infectious growth around the heart valves. Areas of heart muscle that are not working well because of poor blood flow or injury from a heart attack. Aneurysm detection. An aneurysm is a weak or damaged part of an artery wall. The wall bulges out from the normal force of blood pumping through the body. Tell a health care provider about: Any allergies you have. All medicines you are taking, including vitamins, herbs, eye drops, creams, and over-the-counter medicines. Any blood disorders you have. Any surgeries you have had. Any medical conditions you have. Whether you are pregnant or may be pregnant. What are the risks? Generally, this is a safe test. However, problems may occur, including an allergic reaction to dye (contrast) that may be used during the test. What happens before the test? No specific preparation is needed. You may eat and drink normally. What happens during the test? You will take off your clothes from the waist up and put on a hospital gown. Electrodes or electrocardiogram (ECG)patches may be placed on your chest. The electrodes or patches are then connected to a device that monitors your heart rate and rhythm. You will lie down on a table for an ultrasound exam. A gel will be applied to your chest to help sound waves pass through your skin. A handheld device, called a transducer, will be pressed against your chest and moved over your heart. The transducer produces sound waves that travel to your heart and bounce back (or "echo" back) to the transducer. These sound waves will be  captured in real-time and changed into images of your heart that can be viewed on a video monitor. The images will be recorded on a computer and reviewed by your health care provider. You may be asked to change positions or hold your breath for a short time. This makes it easier to get different views or better views of your heart. In some cases, you may receive contrast through an IV in one of your veins. This can improve the quality of the pictures from your heart. The procedure may vary among health care providers and hospitals.   What can I expect after the test? You may return to your normal, everyday life, including diet, activities, and medicines, unless your health care provider tells you not to do that. Follow these instructions at home: It is up to you to get the results of your test. Ask your health care provider, or the department that is doing the test, when your results will be ready. Keep all follow-up visits. This is important. Summary An echocardiogram is a test that uses sound waves (ultrasound) to produce images of the heart. Images from an echocardiogram can provide important information about the size and shape of your heart, heart muscle function, heart valve function, and other possible heart problems. You do not need to do anything  to prepare before this test. You may eat and drink normally. After the echocardiogram is completed, you may return to your normal, everyday life, unless your health care provider tells you not to do that. This information is not intended to replace advice given to you by your health care provider. Make sure you discuss any questions you have with your health care provider. Document Revised: 05/12/2020 Document Reviewed: 05/12/2020 Elsevier Patient Education  2021 [employer].

## 2022-09-15 NOTE — Progress Notes (Signed)
Cardiology Office Note:    Date:  09/15/2022   ID:  Antonio Jimenez, DOB 03/11/1986, MRN [id number]  PCP:  Street, Sharon Mt, MD  Cardiologist:  Jenean Lindau, MD   Referring MD: [address], Sharon Mt, *    ASSESSMENT:    1. Cardiac murmur   2. Venous stasis dermatitis of both lower extremities   3. Mixed dyslipidemia   4. Morbid obesity (Antonio Jimenez)   5. Palpitations    PLAN:    In order of problems listed above:  Primary prevention stressed to the patient.  Importance of compliance with diet medication stressed and vocalized understanding. History of bradycardia and sometimes palpitations: I told the patient that I would like to get blood work done but his mother mentioned to me that he has a physical coming up.  I would be glad to see his CBC and TSH especially in view of palpitations.  Will also do a 2-week monitor. Cardiac murmur: Echocardiogram will be done to assess murmur heard on auscultation. Mixed dyslipidemia and obesity: Weight reduction stressed diet emphasized.  Risks of obesity explained and he promises to do better. Bilateral stasis dermatitis in the lower extremities and mildly diminished pulses so we will do an ABI to evaluate this. Patient will be seen in follow-up appointment in 12 months or earlier if the patient has any concerns    Medication Adjustments/Labs and Tests Ordered: Current medicines are reviewed at length with the patient today.  Concerns regarding medicines are outlined above.  No orders of the defined types were placed in this encounter.  No orders of the defined types were placed in this encounter.    History of Present Illness:    Antonio Jimenez is a 36 y.o. male who is being seen today for the evaluation of slow heartbeat and bradycardia in the past at the request of [address], Sharon Mt, *.  Patient is a 36 year old gentleman.  He is brought in by his mother.  He has developmental delay.  He has had history of bradycardia so the mother  wanted him to get checked.  Occasionally complains of palpitations no dizziness or any syncope or any such issues.  At the time of my evaluation, the patient is alert awake oriented and in no distress.  He has never passed out.  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HCC)    AVM (arteriovenous malformation)    BMI 38.0-38.9,adult    Depression    Development delay    Global developmental delay    Mild persistent asthma without complication    Mixed dyslipidemia    Morbid obesity (HCC)    Reflux gastritis    Seasonal allergies    Venous stasis dermatitis of both lower extremities     Past Surgical History:  Procedure Laterality Date   BRAIN SURGERY     AVM   HERNIA REPAIR  2009   [facility]   RECTAL ABCESS SURGERY  2004   SKIN SURGERY     STRABISMUS SURGERY      Current Medications: Current Meds  Medication Sig   albuterol (VENTOLIN HFA) 108 (90 Base) MCG/ACT inhaler Can inhale two puff every four to six hours as needed for cough or wheeze.   citalopram (CELEXA) 20 MG tablet Take 20 mg by mouth daily.   fluticasone (FLOVENT HFA) 110 MCG/ACT inhaler INHALE 2 PUFFS TWICE A DAY TO PREVENT COUGH OR WHEEZE. USE 3 PUFFS  3 TIMES A DAY DURING FLARE UP.   loratadine (CLARITIN) 10 MG tablet Take 10 mg by mouth daily.   omeprazole (PRILOSEC) 20 MG capsule Take 20 mg by mouth 2 (two) times daily before a meal.     Allergies:   Patient has no known allergies.   Social History   Socioeconomic History   Marital status: Single    Spouse name: Not on file   Number of children: Not on file   Years of education: Not on file   Highest education level: Not on file  Occupational History   Not on file  Tobacco Use   Smoking status: Never   Smokeless tobacco: Never  Substance and Sexual Activity   Alcohol use: Not Currently   Drug use: Never   Sexual activity: Not on file  Other Topics Concern   Not on file  Social History  Narrative   Not on file   Social Determinants of Health   Financial Resource Strain: Not on file  Food Insecurity: Not on file  Transportation Needs: Not on file  Physical Activity: Not on file  Stress: Not on file  Social Connections: Not on file     Family History: The patient's family history includes AVM in his father; Allergic rhinitis in his mother and sister; Asthma in his mother and sister; Atrial fibrillation in his father; Diabetes in his father; Hypertension in his mother; Lung cancer in his father; Stroke in his paternal grandmother.  ROS:   Please see the history of present illness.    All other systems reviewed and are negative.  EKGs/Labs/Other Studies Reviewed:    The following studies were reviewed today: EKG reveals sinus rhythm and nonspecific ST-T changes   Recent Labs: No results found for requested labs within last 365 days.  Recent Lipid Panel No results found for: "CHOL", "TRIG", "HDL", "CHOLHDL", "VLDL", "LDLCALC", "LDLDIRECT"  Physical Exam:    VS:  BP 116/80   Pulse 96   Ht '5\' 8"'$  (1.727 m)   Wt 260 lb 3.2 oz (118 kg)   SpO2 95%   BMI 39.56 kg/m     Wt Readings from Last 3 Encounters:  09/15/22 260 lb 3.2 oz (118 kg)  09/01/22 257 lb (116.6 kg)  10/11/18 233 lb 3.2 oz (105.8 kg)     GEN: Patient is in no acute distress HEENT: Normal NECK: No JVD; No carotid bruits LYMPHATICS: No lymphadenopathy CARDIAC: S1 S2 regular, 2/6 systolic murmur at the apex. RESPIRATORY:  Clear to auscultation without rales, wheezing or rhonchi  ABDOMEN: Soft, non-tender, non-distended MUSCULOSKELETAL:  No edema; No deformity  SKIN: Warm and dry NEUROLOGIC:  Alert and oriented x 3 PSYCHIATRIC:  Normal affect    Signed, Jenean Lindau, MD  09/15/2022 2:13 PM    [facility]

## 2022-09-29 ENCOUNTER — Other Ambulatory Visit: Payer: Self-pay

## 2022-09-29 ENCOUNTER — Telehealth: Payer: Self-pay | Admitting: Cardiology

## 2022-09-29 DIAGNOSIS — R002 Palpitations: Secondary | ICD-10-CM | POA: Diagnosis not present

## 2022-09-29 NOTE — Telephone Encounter (Signed)
Received a call from [facility] at [facility] stating that the patient had a 3.5 second and a 3.8 second pause on 12/20 due to a high grade AV block. The patient reported no symptoms.I spoke to Dr. Agustin Cree regarding this patient and his heart monitor results and he recommended for the patient to have a sleep study done. I called the patient and spoke with his mother and she was agreeable to having the patient do a sleep study done. I explained that the sleep study would have to be pre-certed and she was agreeable to that and she had no further questions at this time.

## 2022-09-29 NOTE — Telephone Encounter (Signed)
Antonio Jimenez with [employer] is calling to report abnormal Zio results.

## 2022-10-05 ENCOUNTER — Ambulatory Visit: Payer: Medicare Other | Attending: Cardiology

## 2022-10-05 ENCOUNTER — Telehealth: Payer: Self-pay | Admitting: *Deleted

## 2022-10-05 ENCOUNTER — Ambulatory Visit (INDEPENDENT_AMBULATORY_CARE_PROVIDER_SITE_OTHER): Payer: Medicare Other

## 2022-10-05 DIAGNOSIS — M79604 Pain in right leg: Secondary | ICD-10-CM | POA: Insufficient documentation

## 2022-10-05 DIAGNOSIS — M79605 Pain in left leg: Secondary | ICD-10-CM | POA: Insufficient documentation

## 2022-10-05 DIAGNOSIS — R011 Cardiac murmur, unspecified: Secondary | ICD-10-CM | POA: Diagnosis not present

## 2022-10-05 DIAGNOSIS — I872 Venous insufficiency (chronic) (peripheral): Secondary | ICD-10-CM

## 2022-10-05 LAB — ECHOCARDIOGRAM COMPLETE
Area-P 1/2: 3.63 cm2
S' Lateral: 2.9 cm

## 2022-10-05 NOTE — Telephone Encounter (Signed)
Staff message sent to Edwyna Shell ok to activate itamar.

## 2022-10-13 ENCOUNTER — Telehealth: Payer: Self-pay

## 2022-10-13 NOTE — Telephone Encounter (Signed)
Attempted to call patient to inform him it was ok to activate his itamar sleep study.. Patient did not answer the phone. Left message for the patient to call back.

## 2022-10-18 NOTE — Telephone Encounter (Signed)
Patient's mother is returning call. 

## 2022-10-20 NOTE — Telephone Encounter (Signed)
Received a message from [place] at the front desk and she stated that the patient's mother came to pick up the itamar sleep study and after reviewing the instructions  refused to have her son do the test. She also stated that her son did not want to do the test either. The order for the test was canceled. The patient's mother had no further questions at this time.

## 2022-10-20 NOTE — Telephone Encounter (Signed)
Called Amy Vasques, the patient's mother and informed her that the Itamar sleep study had been approved with their insurance for the patient to use. I also informed her that I would leave it at the front desk for her to pick up with the instructions. Patient's mother stated she would come buy the office later to pick it up.

## 2022-10-20 NOTE — Telephone Encounter (Signed)
Left a message for the patient to call back.  

## 2022-10-27 DIAGNOSIS — G803 Athetoid cerebral palsy: Secondary | ICD-10-CM | POA: Diagnosis not present

## 2022-10-27 DIAGNOSIS — R7301 Impaired fasting glucose: Secondary | ICD-10-CM | POA: Diagnosis not present

## 2022-10-27 DIAGNOSIS — M1712 Unilateral primary osteoarthritis, left knee: Secondary | ICD-10-CM | POA: Diagnosis not present

## 2022-10-27 DIAGNOSIS — E782 Mixed hyperlipidemia: Secondary | ICD-10-CM | POA: Diagnosis not present

## 2022-10-27 LAB — LAB REPORT - SCANNED: A1c: 5.5

## 2022-11-04 DIAGNOSIS — Z0000 Encounter for general adult medical examination without abnormal findings: Secondary | ICD-10-CM | POA: Diagnosis not present

## 2023-06-26 DIAGNOSIS — L578 Other skin changes due to chronic exposure to nonionizing radiation: Secondary | ICD-10-CM | POA: Diagnosis not present

## 2023-07-20 DIAGNOSIS — Z23 Encounter for immunization: Secondary | ICD-10-CM | POA: Diagnosis not present

## 2023-09-08 ENCOUNTER — Ambulatory Visit: Payer: Medicare Other | Attending: Cardiology | Admitting: Cardiology

## 2023-09-08 VITALS — BP 126/88 | HR 89 | Ht 69.0 in | Wt 274.4 lb

## 2023-09-08 DIAGNOSIS — I4430 Unspecified atrioventricular block: Secondary | ICD-10-CM | POA: Diagnosis not present

## 2023-09-08 DIAGNOSIS — E782 Mixed hyperlipidemia: Secondary | ICD-10-CM | POA: Diagnosis not present

## 2023-09-08 DIAGNOSIS — I441 Atrioventricular block, second degree: Secondary | ICD-10-CM | POA: Insufficient documentation

## 2023-09-08 DIAGNOSIS — Q2730 Arteriovenous malformation, site unspecified: Secondary | ICD-10-CM | POA: Insufficient documentation

## 2023-09-08 DIAGNOSIS — F32A Depression, unspecified: Secondary | ICD-10-CM | POA: Insufficient documentation

## 2023-09-08 HISTORY — DX: Unspecified atrioventricular block: I44.30

## 2023-09-08 NOTE — Patient Instructions (Signed)

## 2023-09-08 NOTE — Progress Notes (Signed)
Cardiology Office Note:    Date:  09/08/2023   ID:  Antonio Jimenez, DOB 04/11/1986, MRN [id number]  PCP:  Street, Stephanie Coup, MD  Cardiologist:  Garwin Brothers, MD   Referring MD: [address] Stephanie Coup, *    ASSESSMENT:    1. Mixed dyslipidemia   2. Morbid obesity (HCC)    PLAN:    In order of problems listed above:  Primary prevention stressed with the patient.  Importance of compliance with diet medication stressed and patient verbalized standing. AV conduction defects and AV block: These have occurred mostly in the night.  Patient is asymptomatic.  Medical management at this time. Morbid obesity: Weight reduction stressed diet emphasized.  He promises to do better. Mixed dyslipidemia: Diet emphasized.  Patient understands the importance of healthy diet.  This is followed by primary care. Patient will be seen in follow-up appointment in 12 months or earlier if the patient has any concerns.    Medication Adjustments/Labs and Tests Ordered: Current medicines are reviewed at length with the patient today.  Concerns regarding medicines are outlined above.  Orders Placed This Encounter  Procedures   EKG 12-Lead   No orders of the defined types were placed in this encounter.    No chief complaint on file.    History of Present Illness:    Antonio Jimenez is a 37 y.o. male.  Patient has past medical history of morbid obesity, AV block late in the night hours and mixed dyslipidemia.  He has venous stasis dermatitis of both lower extremities.  He denies any chest pain orthopnea or PND.  At the time of my evaluation, the patient is alert awake oriented and in no distress.  He leads a sedentary lifestyle.  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HCC)    AVM (arteriovenous malformation)    BMI 38.0-38.9,adult    Cardiac murmur 09/15/2022   Depression    Development delay    Global developmental delay    Mild persistent asthma without  complication    Mixed dyslipidemia    Morbid obesity (HCC)    Palpitations 09/15/2022   Reflux gastritis    Seasonal allergies    Venous stasis dermatitis of both lower extremities     Past Surgical History:  Procedure Laterality Date   BRAIN SURGERY     AVM   HERNIA REPAIR  2009   INGUINAL HERNIA REPAIR  1986   RECTAL ABCESS SURGERY  2004   SKIN SURGERY     STRABISMUS SURGERY      Current Medications: Current Meds  Medication Sig   albuterol (VENTOLIN HFA) 108 (90 Base) MCG/ACT inhaler Can inhale two puff every four to six hours as needed for cough or wheeze.   citalopram (CELEXA) 20 MG tablet Take 20 mg by mouth daily.   fluticasone (FLOVENT HFA) 110 MCG/ACT inhaler INHALE 2 PUFFS TWICE A DAY TO PREVENT COUGH OR WHEEZE. USE 3 PUFFS 3 TIMES A DAY DURING FLARE UP.   loratadine (CLARITIN) 10 MG tablet Take 10 mg by mouth daily.   omeprazole (PRILOSEC) 20 MG capsule Take 20 mg by mouth 2 (two) times daily before a meal.     Allergies:   Patient has no known allergies.   Social History   Socioeconomic History   Marital status: Single    Spouse name: Not on file   Number of children: Not on file   Years of education: Not on  file   Highest education level: Not on file  Occupational History   Not on file  Tobacco Use   Smoking status: Never   Smokeless tobacco: Never  Substance and Sexual Activity   Alcohol use: Not Currently   Drug use: Never   Sexual activity: Not on file  Other Topics Concern   Not on file  Social History Narrative   Not on file   Social Determinants of Health   Financial Resource Strain: Not on file  Food Insecurity: Not on file  Transportation Needs: Not on file  Physical Activity: Not on file  Stress: Not on file  Social Connections: Not on file     Family History: The patient's family history includes AVM in his father; Allergic rhinitis in his mother and sister; Asthma in his mother and sister; Atrial fibrillation in his father;  Diabetes in his father; Hypertension in his mother; Lung cancer in his father; Stroke in his paternal grandmother.  ROS:   Please see the history of present illness.    All other systems reviewed and are negative.  EKGs/Labs/Other Studies Reviewed:    The following studies were reviewed today: I discussed my findings with the patient at length   Recent Labs: No results found for requested labs within last 365 days.  Recent Lipid Panel No results found for: "CHOL", "TRIG", "HDL", "CHOLHDL", "VLDL", "LDLCALC", "LDLDIRECT"  Physical Exam:    VS:  BP 126/88   Pulse 89   Ht 5\' 9"  (1.753 m)   Wt 274 lb 6.4 oz (124.5 kg)   SpO2 96%   BMI 40.52 kg/m     Wt Readings from Last 3 Encounters:  09/08/23 274 lb 6.4 oz (124.5 kg)  09/15/22 260 lb 3.2 oz (118 kg)  09/01/22 257 lb (116.6 kg)     GEN: Patient is in no acute distress HEENT: Normal NECK: No JVD; No carotid bruits LYMPHATICS: No lymphadenopathy CARDIAC: Hear sounds regular, 2/6 systolic murmur at the apex. RESPIRATORY:  Clear to auscultation without rales, wheezing or rhonchi  ABDOMEN: Soft, non-tender, non-distended MUSCULOSKELETAL:  No edema; No deformity  SKIN: Warm and dry NEUROLOGIC:  Alert and oriented x 3 PSYCHIATRIC:  Normal affect   Signed, Garwin Brothers, MD  09/08/2023 4:47 PM    [facility]

## 2023-09-13 DIAGNOSIS — Z6841 Body Mass Index (BMI) 40.0 and over, adult: Secondary | ICD-10-CM | POA: Diagnosis not present

## 2023-09-13 DIAGNOSIS — R7301 Impaired fasting glucose: Secondary | ICD-10-CM | POA: Diagnosis not present

## 2023-09-13 DIAGNOSIS — Z1331 Encounter for screening for depression: Secondary | ICD-10-CM | POA: Diagnosis not present

## 2023-09-13 DIAGNOSIS — M1712 Unilateral primary osteoarthritis, left knee: Secondary | ICD-10-CM | POA: Diagnosis not present

## 2023-11-03 DIAGNOSIS — F88 Other disorders of psychological development: Secondary | ICD-10-CM | POA: Diagnosis not present

## 2023-11-03 DIAGNOSIS — E782 Mixed hyperlipidemia: Secondary | ICD-10-CM | POA: Diagnosis not present

## 2023-11-03 DIAGNOSIS — I872 Venous insufficiency (chronic) (peripheral): Secondary | ICD-10-CM | POA: Diagnosis not present

## 2023-11-03 DIAGNOSIS — G803 Athetoid cerebral palsy: Secondary | ICD-10-CM | POA: Diagnosis not present

## 2023-11-03 DIAGNOSIS — R7301 Impaired fasting glucose: Secondary | ICD-10-CM | POA: Diagnosis not present

## 2023-11-03 DIAGNOSIS — S80811A Abrasion, right lower leg, initial encounter: Secondary | ICD-10-CM | POA: Diagnosis not present

## 2023-11-03 DIAGNOSIS — Z79899 Other long term (current) drug therapy: Secondary | ICD-10-CM | POA: Diagnosis not present

## 2023-11-03 DIAGNOSIS — Z131 Encounter for screening for diabetes mellitus: Secondary | ICD-10-CM | POA: Diagnosis not present

## 2023-11-16 DIAGNOSIS — Z0000 Encounter for general adult medical examination without abnormal findings: Secondary | ICD-10-CM | POA: Diagnosis not present

## 2024-01-07 DIAGNOSIS — R051 Acute cough: Secondary | ICD-10-CM | POA: Diagnosis not present

## 2024-01-07 DIAGNOSIS — R0981 Nasal congestion: Secondary | ICD-10-CM | POA: Diagnosis not present

## 2024-01-07 DIAGNOSIS — R0982 Postnasal drip: Secondary | ICD-10-CM | POA: Diagnosis not present

## 2024-03-28 DIAGNOSIS — R0981 Nasal congestion: Secondary | ICD-10-CM | POA: Diagnosis not present

## 2024-03-28 DIAGNOSIS — R070 Pain in throat: Secondary | ICD-10-CM | POA: Diagnosis not present

## 2024-03-28 DIAGNOSIS — R051 Acute cough: Secondary | ICD-10-CM | POA: Diagnosis not present

## 2024-03-28 DIAGNOSIS — R112 Nausea with vomiting, unspecified: Secondary | ICD-10-CM | POA: Diagnosis not present

## 2024-06-20 DIAGNOSIS — M1712 Unilateral primary osteoarthritis, left knee: Secondary | ICD-10-CM | POA: Diagnosis not present

## 2024-06-20 DIAGNOSIS — Z6841 Body Mass Index (BMI) 40.0 and over, adult: Secondary | ICD-10-CM | POA: Diagnosis not present

## 2024-06-20 DIAGNOSIS — M7052 Other bursitis of knee, left knee: Secondary | ICD-10-CM | POA: Diagnosis not present

## 2024-06-25 DIAGNOSIS — R600 Localized edema: Secondary | ICD-10-CM | POA: Diagnosis not present

## 2024-06-25 DIAGNOSIS — L219 Seborrheic dermatitis, unspecified: Secondary | ICD-10-CM | POA: Diagnosis not present

## 2024-07-26 DIAGNOSIS — Z23 Encounter for immunization: Secondary | ICD-10-CM | POA: Diagnosis not present

## 2024-09-03 ENCOUNTER — Ambulatory Visit

## 2024-09-03 VITALS — BP 110/80 | HR 77 | Ht 69.0 in | Wt 283.2 lb

## 2024-09-03 DIAGNOSIS — I493 Ventricular premature depolarization: Secondary | ICD-10-CM | POA: Insufficient documentation

## 2024-09-03 DIAGNOSIS — I872 Venous insufficiency (chronic) (peripheral): Secondary | ICD-10-CM | POA: Insufficient documentation

## 2024-09-03 DIAGNOSIS — E782 Mixed hyperlipidemia: Secondary | ICD-10-CM | POA: Diagnosis not present

## 2024-09-03 DIAGNOSIS — I441 Atrioventricular block, second degree: Secondary | ICD-10-CM | POA: Insufficient documentation

## 2024-09-03 MED ORDER — FUROSEMIDE 20 MG PO TABS: 20.0000 mg | ORAL_TABLET | ORAL | 3 refills | Status: AC

## 2024-09-03 NOTE — Assessment & Plan Note (Signed)
 No recent lipid panel to review.  Currently not on any lipid-lowering therapy. He will obtain lipid panel with his next blood work If total cholesterol above 200 or LDL above 130 mg/dL will start lipid-lowering therapy.

## 2024-09-03 NOTE — Assessment & Plan Note (Signed)
 Intermittent tach and second-degree AV block noted on heart monitor from December 2023.  Likely related to sleep related breathing disorders.  Recommend sleep study. Recommended to further discuss with PCP and proceed with sleep study.

## 2024-09-03 NOTE — Patient Instructions (Signed)
 Medication Instructions:  Your physician recommends that you make the following medication changes: Take Lasix 20 mg every other day  Please refer to the Current Medication list given to you today.  *If you need a refill on your cardiac medications before your next appointment, please call your pharmacy*   Lab Work: Your physician recommends that you return for lab work in: 10 days Lipid panel, CMP, CBC You need to have labs done when you are fasting.  You can come Monday through Friday 8:30 am to 12:00 pm and 1:15 to 4:30. You do not need to make an appointment as the order has already been placed.   If you have labs (blood work) drawn today and your tests are completely normal, you will receive your results only by: MyChart Message (if you have MyChart) OR A paper copy in the mail If you have any lab test that is abnormal or we need to change your treatment, we will call you to review the results.   Testing/Procedures: None ordered   Follow-Up: At [facility], you and your health needs are our priority.  As part of our continuing mission to provide you with exceptional heart care, we have created designated Provider Care Teams.  These Care Teams include your primary Cardiologist (physician) and Advanced Practice Providers (APPs -  Physician Assistants and Nurse Practitioners) who all work together to provide you with the care you need, when you need it.  We recommend signing up for the patient portal called MyChart.  Sign up information is provided on this After Visit Summary.  MyChart is used to connect with patients for Virtual Visits (Telemedicine).  Patients are able to view lab/test results, encounter notes, upcoming appointments, etc.  Non-urgent messages can be sent to your provider as well.   To learn more about what you can do with MyChart, go to [email or web address].    Your next appointment:   3 month(s)  The format for your next appointment:   In  Person  Provider:   Alean Kobus, MD    Other Instructions none  Important Information About Sugar

## 2024-09-03 NOTE — Assessment & Plan Note (Signed)
 Appears to be a chronic issue with venous stasis. However appears to show some pitting pedal edema features.  Educated about salt restriction. No strict fluid restriction required at this time.  Would empirically start low-dose Lasix 20 mg every other day.  Will obtain blood work BMP tentatively in 1 week to assess electrolytes kidney function and liver function.  Advised to keep feet elevated when at rest or sitting down and use compression socks if possible.

## 2024-09-03 NOTE — Progress Notes (Signed)
 Cardiology Consultation:    Date:  09/03/2024   ID:  Antonio Jimenez, DOB 1986/02/03, MRN [id number]  PCP:  Street, Antonio HERO, MD  Cardiologist:  Alean SAUNDERS Nilah Belcourt, MD   Referring MD: Street, Antonio HERO, *   No chief complaint on file.    ASSESSMENT AND PLAN:   Antonio Jimenez 38 year old young male history of cognitive developmental delay with his mother being primary caregiver. Also has history of obesity, hyperlipidemia, abnormal heart monitor from December 2023 with overnight pauses up to 3.8 seconds [2 episodes] consistent with type I second-degree AV block, frequent ventricular ectopy burden 5%.  Subsequent echocardiogram 10/05/2022 noted normal biventricular function with LVEF 60 to 65%, no significant valve abnormality. Vascular ultrasound bilateral lower extremities January 2024 noted normal ABIs. History of sleep study close to 15 years ago was unremarkable.  No recent repeat study.   Here for follow-up visit and have noticed an increase in his baseline chronic bilateral lower extremity edema.  Problem List Items Addressed This Visit     Hyperlipidemia - Primary   No recent lipid panel to review.  Currently not on any lipid-lowering therapy. He will obtain lipid panel with his next blood work If total cholesterol above 200 or LDL above 130 mg/dL will start lipid-lowering therapy.      Relevant Medications   furosemide (LASIX) 20 MG tablet   Venous stasis dermatitis of both lower extremities   Appears to be a chronic issue with venous stasis. However appears to show some pitting pedal edema features.  Educated about salt restriction. No strict fluid restriction required at this time.  Would empirically start low-dose Lasix 20 mg every other day.  Will obtain blood work BMP tentatively in 1 week to assess electrolytes kidney function and liver function.  Advised to keep feet elevated when at rest or sitting down and use compression socks if possible.        Relevant Medications   furosemide (LASIX) 20 MG tablet   Second degree AV block, Mobitz type I   Intermittent tach and second-degree AV block noted on heart monitor from December 2023.  Likely related to sleep related breathing disorders.  Recommend sleep study. Recommended to further discuss with PCP and proceed with sleep study.      Relevant Medications   furosemide (LASIX) 20 MG tablet   Frequent PVCs   Frequent PVCs up to 5% on heart monitor from December 2023. Asymptomatic. I probability of this being related to his underlying sleep disordered breathing. No significant structural and functional issues on echocardiogram from January 2024.  No significant cardiac symptoms at this time to further pursue ischemic workup.  Recommend further evaluation with sleep apnea. Target weight loss by dietary modifications and increased activity.       Relevant Medications   furosemide (LASIX) 20 MG tablet   Return to clinic tentatively in 3 months.   History of Present Illness:    Antonio Jimenez is a 38 y.o. male who is being seen today for follow-up visit. PCP is Antonio Jimenez, *.   Last visit in our office was with Dr. Revankar 09/08/2023.  Here for the visit today accompanied by his mother and his sister.  At home lives with his mother.  Has history of cognitive developmental delay with his mother being primary caregiver. Also has history of obesity, hyperlipidemia, abnormal heart monitor from her December 2023 with overnight pauses up to 3.8 seconds [2 episodes] consistent with type I second-degree AV block,  frequent ventricular ectopy burden 5%.  Subsequent echocardiogram 10/05/2022 noted normal biventricular function with LVEF 60 to 65%, no significant valve abnormality. Vascular ultrasound bilateral lower extremities January 2024 noted normal ABIs. History of sleep study close to 15 years ago was unremarkable.  No recent repeat study.  Routinely day-to-day relatively  sedentary.  Plays video games.  Ambulates without any limitation.  Goes bowling once a week with his uncle. Mentions he does get sweaty by the end of bowling sessions.  Denies any chest pain, shortness of breath, orthopnea, paroxysmal nocturnal dyspnea.  Denies any palpitations, lightheadedness, dizziness, syncopal or near syncopal episodes.  Has bilateral lower extremity edema which has been chronic and seemingly increasing over the past couple years.  Worse towards the end of the day.   Weight has gone up close to 20 pounds in the past 2 years and almost 10 pounds since last December.  Dietary habits are not ideal.  There is some snoring noted.  No recent sleep study.  Good compliance with medications.  EKG in the clinic today shows sinus rhythm heart rate 77/min, PR interval normal 174 ms, QRS duration 94 ms, no ischemic changes.  Last lipid panel is from November 03, 2023 total cholesterol 216, HDL 45, triglycerides 212. LDL not available to review.  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HCC)    AV heart block 09/08/2023   AVM (arteriovenous malformation)    BMI 38.0-38.9,adult    Cardiac murmur 09/15/2022   Depression    Development delay    Global developmental delay    Mild persistent asthma without complication    Mixed dyslipidemia    Morbid obesity (HCC)    Palpitations 09/15/2022   Reflux gastritis    Seasonal allergies    Venous stasis dermatitis of both lower extremities     Past Surgical History:  Procedure Laterality Date   BRAIN SURGERY     AVM   HERNIA REPAIR  2009   INGUINAL HERNIA REPAIR  1986   RECTAL ABCESS SURGERY  2004   SKIN SURGERY     STRABISMUS SURGERY      Current Medications: Current Meds  Medication Sig   albuterol  (VENTOLIN  HFA) 108 (90 Base) MCG/ACT inhaler Can inhale two puff every four to six hours as needed for cough or wheeze.   citalopram (CELEXA) 20 MG tablet Take 20 mg by mouth daily.    fluticasone  (FLOVENT  HFA) 110 MCG/ACT inhaler INHALE 2 PUFFS TWICE A DAY TO PREVENT COUGH OR WHEEZE. USE 3 PUFFS 3 TIMES A DAY DURING FLARE UP.   furosemide (LASIX) 20 MG tablet Take 1 tablet (20 mg total) by mouth every other day.   loratadine (CLARITIN) 10 MG tablet Take 10 mg by mouth daily.   omeprazole (PRILOSEC) 20 MG capsule Take 20 mg by mouth 2 (two) times daily before a meal.   [DISCONTINUED] meloxicam (MOBIC) 15 MG tablet Take 15 mg by mouth daily as needed.     Allergies:   Patient has no known allergies.   Social History   Socioeconomic History   Marital status: Single    Spouse name: Not on file   Number of children: Not on file   Years of education: Not on file   Highest education level: Not on file  Occupational History   Not on file  Tobacco Use   Smoking status: Never   Smokeless tobacco: Never  Substance and Sexual Activity   Alcohol  use: Not Currently   Drug use: Never   Sexual activity: Not on file  Other Topics Concern   Not on file  Social History Narrative   Not on file   Social Drivers of Health   Financial Resource Strain: Not on file  Food Insecurity: Not on file  Transportation Needs: Not on file  Physical Activity: Not on file  Stress: Not on file  Social Connections: Not on file     Family History: The patient's family history includes AVM in his father; Allergic rhinitis in his mother and sister; Asthma in his mother and sister; Atrial fibrillation in his father; Diabetes in his father; Hypertension in his mother; Lung cancer in his father; Stroke in his paternal grandmother. ROS:   Please see the history of present illness.    All 14 point review of systems negative except as described per history of present illness.  EKGs/Labs/Other Studies Reviewed:    The following studies were reviewed today:   EKG:  EKG Interpretation Date/Time:  Tuesday September 03 2024 11:31:30 EST Ventricular Rate:  77 PR Interval:  174 QRS  Duration:  94 QT Interval:  384 QTC Calculation: 434 R Axis:   13  Text Interpretation: Normal sinus rhythm with sinus arrhythmia Normal ECG When compared with ECG of 08-Sep-2023 17:29, No significant change was found Confirmed by Liborio Hai reddy [id number]) on 09/03/2024 11:39:48 AM    Recent Labs: No results found for requested labs within last 365 days.  Recent Lipid Panel No results found for: CHOL, TRIG, HDL, CHOLHDL, VLDL, LDLCALC, LDLDIRECT  Physical Exam:    VS:  BP 110/80   Pulse 77   Ht 5' 9 (1.753 m)   Wt 283 lb 3.2 oz (128.5 kg)   SpO2 96%   BMI 41.82 kg/m     Wt Readings from Last 3 Encounters:  09/03/24 283 lb 3.2 oz (128.5 kg)  09/08/23 274 lb 6.4 oz (124.5 kg)  09/15/22 260 lb 3.2 oz (118 kg)     GENERAL:  Well nourished, well developed in no acute distress NECK: No JVD; No carotid bruits CARDIAC: RRR, S1 and S2 present, no murmurs, no rubs, no gallops CHEST:  Clear to auscultation without rales, wheezing or rhonchi  Extremities: Trace bilateral pitting pedal edema up to the knees, associated with chronic venous stasis related hyperpigmentation changes. Pulses bilaterally symmetric with radial 2+ and dorsalis pedis 2+ NEUROLOGIC:  Alert and oriented x 3  Medication Adjustments/Labs and Tests Ordered: Current medicines are reviewed at length with the patient today.  Concerns regarding medicines are outlined above.  Orders Placed This Encounter  Procedures   Lipid Profile   Comp Met (CMET)   CBC   EKG 12-Lead   Meds ordered this encounter  Medications   furosemide (LASIX) 20 MG tablet    Sig: Take 1 tablet (20 mg total) by mouth every other day.    Dispense:  90 tablet    Refill:  3    Signed, Fredrick Dray reddy Kelcy Laible, MD, MPH, [facility]. 09/03/2024 12:41 PM    [facility]

## 2024-09-03 NOTE — Assessment & Plan Note (Signed)
 Frequent PVCs up to 5% on heart monitor from December 2023. Asymptomatic. I probability of this being related to his underlying sleep disordered breathing. No significant structural and functional issues on echocardiogram from January 2024.  No significant cardiac symptoms at this time to further pursue ischemic workup.  Recommend further evaluation with sleep apnea. Target weight loss by dietary modifications and increased activity.

## 2024-09-13 DIAGNOSIS — E782 Mixed hyperlipidemia: Secondary | ICD-10-CM | POA: Diagnosis not present

## 2024-09-14 LAB — COMPREHENSIVE METABOLIC PANEL WITH GFR
ALT: 29 IU/L (ref 0–44)
AST: 21 IU/L (ref 0–40)
Albumin: 4.4 g/dL (ref 4.1–5.1)
Alkaline Phosphatase: 129 IU/L — ABNORMAL HIGH (ref 47–123)
BUN/Creatinine Ratio: 19 (ref 9–20)
BUN: 18 mg/dL (ref 6–20)
Bilirubin Total: 0.4 mg/dL (ref 0.0–1.2)
CO2: 24 mmol/L (ref 20–29)
Calcium: 9 mg/dL (ref 8.7–10.2)
Chloride: 101 mmol/L (ref 96–106)
Creatinine, Ser: 0.96 mg/dL (ref 0.76–1.27)
Globulin, Total: 2.6 g/dL (ref 1.5–4.5)
Glucose: 82 mg/dL (ref 70–99)
Potassium: 3.9 mmol/L (ref 3.5–5.2)
Sodium: 139 mmol/L (ref 134–144)
Total Protein: 7 g/dL (ref 6.0–8.5)
eGFR: 103 mL/min/1.73 (ref >59)

## 2024-09-14 LAB — LIPID PANEL
Chol/HDL Ratio: 4.9 ratio (ref 0.0–5.0)
Cholesterol, Total: 197 mg/dL (ref 100–199)
HDL: 40 mg/dL (ref >39)
LDL Chol Calc (NIH): 123 mg/dL — ABNORMAL HIGH (ref 0–99)
Triglycerides: 190 mg/dL — ABNORMAL HIGH (ref 0–149)
VLDL Cholesterol Cal: 34 mg/dL (ref 5–40)

## 2024-09-14 LAB — CBC
Hematocrit: 44.5 % (ref 37.5–51.0)
Hemoglobin: 14.4 g/dL (ref 13.0–17.7)
MCH: 29.7 pg (ref 26.6–33.0)
MCHC: 32.4 g/dL (ref 31.5–35.7)
MCV: 92 fL (ref 79–97)
Platelets: 220 x10E3/uL (ref 150–450)
RBC: 4.85 x10E6/uL (ref 4.14–5.80)
RDW: 13.3 % (ref 11.6–15.4)
WBC: 8.9 x10E3/uL (ref 3.4–10.8)

## 2024-09-20 ENCOUNTER — Telehealth: Payer: Self-pay

## 2024-09-20 NOTE — Telephone Encounter (Signed)
 Pt mother requesting a call regarding results of labs

## 2024-12-03 ENCOUNTER — Ambulatory Visit
